# Patient Record
Sex: Male | Born: 1965 | Race: Black or African American | Hispanic: No | Marital: Single | Smoking: Never smoker
Health system: Southern US, Community
[De-identification: ages and names within clinical notes are randomized; demographics above are authoritative.]

## PROBLEM LIST (undated history)

## (undated) HISTORY — PX: HERNIA REPAIR: SHX51

---

## 2008-10-19 ENCOUNTER — Emergency Department: Payer: Self-pay | Admitting: Emergency Medicine

## 2010-06-28 ENCOUNTER — Emergency Department: Payer: Self-pay | Admitting: Emergency Medicine

## 2010-07-11 ENCOUNTER — Ambulatory Visit: Payer: Self-pay | Admitting: Surgery

## 2010-07-18 ENCOUNTER — Ambulatory Visit: Payer: Self-pay | Admitting: Surgery

## 2010-07-19 LAB — PATHOLOGY REPORT

## 2012-04-14 ENCOUNTER — Emergency Department: Payer: Self-pay | Admitting: Internal Medicine

## 2012-12-30 ENCOUNTER — Emergency Department: Payer: Self-pay | Admitting: Emergency Medicine

## 2013-06-01 ENCOUNTER — Emergency Department: Payer: Self-pay | Admitting: Emergency Medicine

## 2013-06-02 LAB — CBC
HCT: 42.2 % (ref 40.0–52.0)
HCT: 49.9 % (ref 40.0–52.0)
HGB: 14.1 g/dL (ref 13.0–18.0)
HGB: 16.9 g/dL (ref 13.0–18.0)
MCH: 28.2 pg (ref 26.0–34.0)
MCHC: 33.3 g/dL (ref 32.0–36.0)
MCV: 85 fL (ref 80–100)
Platelet: 314 10*3/uL (ref 150–440)
RBC: 5.94 10*6/uL — ABNORMAL HIGH (ref 4.40–5.90)
RDW: 14.8 % — ABNORMAL HIGH (ref 11.5–14.5)
WBC: 21.9 10*3/uL — ABNORMAL HIGH (ref 3.8–10.6)
WBC: 24.1 10*3/uL — ABNORMAL HIGH (ref 3.8–10.6)

## 2013-06-02 LAB — URINALYSIS, COMPLETE
Bilirubin,UR: NEGATIVE
Ketone: NEGATIVE
Nitrite: NEGATIVE
RBC,UR: 5 /HPF (ref 0–5)
WBC UR: 5 /HPF (ref 0–5)

## 2013-06-02 LAB — COMPREHENSIVE METABOLIC PANEL
Anion Gap: 9 (ref 7–16)
BUN: 11 mg/dL (ref 7–18)
Bilirubin,Total: 0.8 mg/dL (ref 0.2–1.0)
Co2: 22 mmol/L (ref 21–32)
Creatinine: 1.37 mg/dL — ABNORMAL HIGH (ref 0.60–1.30)
EGFR (African American): >60
Glucose: 110 mg/dL — ABNORMAL HIGH (ref 65–99)
SGPT (ALT): 16 U/L (ref 12–78)
Sodium: 130 mmol/L — ABNORMAL LOW (ref 136–145)
Total Protein: 9 g/dL — ABNORMAL HIGH (ref 6.4–8.2)

## 2013-06-02 LAB — LIPASE, BLOOD: Lipase: 95 U/L (ref 73–393)

## 2013-06-02 LAB — TROPONIN I: Troponin-I: <0.02 ng/mL

## 2013-06-03 LAB — BETA STREP CULTURE(ARMC)

## 2013-08-26 ENCOUNTER — Emergency Department: Payer: Self-pay | Admitting: Emergency Medicine

## 2014-03-01 ENCOUNTER — Emergency Department: Payer: Self-pay | Admitting: Emergency Medicine

## 2014-03-01 LAB — CBC
HCT: 48.9 % (ref 40.0–52.0)
HGB: 15.8 g/dL (ref 13.0–18.0)
MCH: 27.6 pg (ref 26.0–34.0)
MCHC: 32.3 g/dL (ref 32.0–36.0)
MCV: 86 fL (ref 80–100)
PLATELETS: 337 10*3/uL (ref 150–440)
RBC: 5.72 10*6/uL (ref 4.40–5.90)
RDW: 15 % — ABNORMAL HIGH (ref 11.5–14.5)
WBC: 25.2 10*3/uL — AB (ref 3.8–10.6)

## 2014-03-01 LAB — DIFFERENTIAL
BASOS ABS: 0.1 10*3/uL (ref 0.0–0.1)
Basophil %: 0.5 %
EOS ABS: 0 10*3/uL (ref 0.0–0.7)
Eosinophil %: 0.2 %
LYMPHS ABS: 3.1 10*3/uL (ref 1.0–3.6)
LYMPHS PCT: 12.5 %
MONO ABS: 1.9 x10 3/mm — AB (ref 0.2–1.0)
Monocyte %: 7.7 %
NEUTROS ABS: 19.9 10*3/uL — AB (ref 1.4–6.5)
Neutrophil %: 79.1 %

## 2014-03-01 LAB — BASIC METABOLIC PANEL
Anion Gap: 8 (ref 7–16)
BUN: 9 mg/dL (ref 7–18)
Calcium, Total: 9 mg/dL (ref 8.5–10.1)
Chloride: 100 mmol/L (ref 98–107)
Co2: 24 mmol/L (ref 21–32)
Creatinine: 1.34 mg/dL — ABNORMAL HIGH (ref 0.60–1.30)
Glucose: 141 mg/dL — ABNORMAL HIGH (ref 65–99)
Osmolality: 266 (ref 275–301)
POTASSIUM: 3.5 mmol/L (ref 3.5–5.1)
SODIUM: 132 mmol/L — AB (ref 136–145)

## 2014-03-04 LAB — BETA STREP CULTURE(ARMC)

## 2017-02-21 ENCOUNTER — Emergency Department: Payer: Self-pay

## 2017-02-21 ENCOUNTER — Emergency Department
Admission: EM | Admit: 2017-02-21 | Discharge: 2017-02-21 | Disposition: A | Discharge status: Home / Self Care | Payer: Self-pay | Attending: Emergency Medicine | Admitting: Emergency Medicine

## 2017-02-21 ENCOUNTER — Encounter: Payer: Self-pay | Admitting: Emergency Medicine

## 2017-02-21 DIAGNOSIS — J0100 Acute maxillary sinusitis, unspecified: Secondary | ICD-10-CM | POA: Insufficient documentation

## 2017-02-21 DIAGNOSIS — R51 Headache: Secondary | ICD-10-CM

## 2017-02-21 DIAGNOSIS — R519 Headache, unspecified: Secondary | ICD-10-CM

## 2017-02-21 LAB — COMPREHENSIVE METABOLIC PANEL
ALBUMIN: 3.7 g/dL (ref 3.5–5.0)
ALK PHOS: 63 U/L (ref 38–126)
ALT: 9 U/L — AB (ref 17–63)
ANION GAP: 7 (ref 5–15)
AST: 20 U/L (ref 15–41)
BILIRUBIN TOTAL: 0.9 mg/dL (ref 0.3–1.2)
BUN: 12 mg/dL (ref 6–20)
CALCIUM: 9.2 mg/dL (ref 8.9–10.3)
CO2: 25 mmol/L (ref 22–32)
CREATININE: 0.93 mg/dL (ref 0.61–1.24)
Chloride: 103 mmol/L (ref 101–111)
GFR calc Af Amer: >60 mL/min (ref >60)
GFR calc non Af Amer: >60 mL/min (ref >60)
GLUCOSE: 117 mg/dL — AB (ref 65–99)
Potassium: 4.4 mmol/L (ref 3.5–5.1)
Sodium: 135 mmol/L (ref 135–145)
Total Protein: 8.2 g/dL — ABNORMAL HIGH (ref 6.5–8.1)

## 2017-02-21 LAB — CBC WITH DIFFERENTIAL/PLATELET
BASOS PCT: 1 %
Basophils Absolute: 0.1 10*3/uL (ref 0–0.1)
Eosinophils Absolute: 0.1 10*3/uL (ref 0–0.7)
Eosinophils Relative: 0 %
HEMATOCRIT: 45.2 % (ref 40.0–52.0)
HEMOGLOBIN: 15.1 g/dL (ref 13.0–18.0)
LYMPHS ABS: 2.9 10*3/uL (ref 1.0–3.6)
Lymphocytes Relative: 19 %
MCH: 27.9 pg (ref 26.0–34.0)
MCHC: 33.5 g/dL (ref 32.0–36.0)
MCV: 83.3 fL (ref 80.0–100.0)
MONOS PCT: 10 %
Monocytes Absolute: 1.6 10*3/uL — ABNORMAL HIGH (ref 0.2–1.0)
NEUTROS ABS: 10.9 10*3/uL — AB (ref 1.4–6.5)
NEUTROS PCT: 70 %
Platelets: 383 10*3/uL (ref 150–440)
RBC: 5.42 MIL/uL (ref 4.40–5.90)
RDW: 15.5 % — ABNORMAL HIGH (ref 11.5–14.5)
WBC: 15.6 10*3/uL — ABNORMAL HIGH (ref 3.8–10.6)

## 2017-02-21 LAB — SEDIMENTATION RATE: Sed Rate: 9 mm/hr (ref 0–15)

## 2017-02-21 MED ORDER — DEXAMETHASONE SODIUM PHOSPHATE 10 MG/ML IJ SOLN
10.0000 mg | Freq: Once | INTRAMUSCULAR | Status: AC
  Administered 2017-02-21: 10 mg via INTRAVENOUS
  Filled 2017-02-21: qty 1

## 2017-02-21 MED ORDER — PREDNISONE 10 MG (21) PO TBPK: ORAL_TABLET | ORAL | 0 refills | Status: DC

## 2017-02-21 MED ORDER — DIPHENHYDRAMINE HCL 50 MG/ML IJ SOLN
25.0000 mg | Freq: Once | INTRAMUSCULAR | Status: AC
  Administered 2017-02-21: 25 mg via INTRAVENOUS
  Filled 2017-02-21: qty 1

## 2017-02-21 MED ORDER — SODIUM CHLORIDE 0.9 % IV SOLN
1000.0000 mL | Freq: Once | INTRAVENOUS | Status: AC
  Administered 2017-02-21: 1000 mL via INTRAVENOUS

## 2017-02-21 MED ORDER — MORPHINE SULFATE (PF) 4 MG/ML IV SOLN
4.0000 mg | Freq: Once | INTRAVENOUS | Status: AC
Start: 2017-02-21 — End: 2017-02-21
  Administered 2017-02-21: 4 mg via INTRAVENOUS
  Filled 2017-02-21: qty 1

## 2017-02-21 MED ORDER — CEFTRIAXONE SODIUM IN DEXTROSE 20 MG/ML IV SOLN
1.0000 g | Freq: Once | INTRAVENOUS | Status: AC
  Administered 2017-02-21: 1 g via INTRAVENOUS
  Filled 2017-02-21: qty 50

## 2017-02-21 MED ORDER — AMOXICILLIN-POT CLAVULANATE 875-125 MG PO TABS: 1.0000 | ORAL_TABLET | Freq: Two times a day (BID) | ORAL | 0 refills | Status: AC

## 2017-02-21 MED ORDER — METOCLOPRAMIDE HCL 5 MG/ML IJ SOLN
10.0000 mg | Freq: Once | INTRAMUSCULAR | Status: AC
  Administered 2017-02-21: 10 mg via INTRAVENOUS
  Filled 2017-02-21: qty 2

## 2017-02-21 MED ORDER — KETOROLAC TROMETHAMINE 30 MG/ML IJ SOLN
30.0000 mg | Freq: Once | INTRAMUSCULAR | Status: AC
  Administered 2017-02-21: 30 mg via INTRAVENOUS
  Filled 2017-02-21: qty 1

## 2017-02-21 MED ORDER — OXYMETAZOLINE HCL 0.05 % NA SOLN: 2.0000 | Freq: Two times a day (BID) | NASAL | 2 refills | Status: AC

## 2017-02-21 NOTE — ED Triage Notes (Signed)
Pt to triage via w/c, appears uncomfortable; st since Sunday has had left sided HA with no accomp symptoms; denies hx of same

## 2017-02-21 NOTE — ED Provider Notes (Signed)
[facility] Emergency Department Provider Note       Time seen: ----------------------------------------- 7:09 AM on 02/21/2017 -----------------------------------------     I have reviewed the triage vital signs and the nursing notes.   HISTORY   Chief Complaint Headache    HPI Nicholas Thornton is a 51 y.o. male who presents to the ED for severe left-sided headache since Sunday. Patient describes pain as 9 out of 10 and a throbbing type pain. Patient states he's had fever and chills, left-sided nasal congestion and some nausea. Patient does not describe light or sound is affecting his symptoms.   History reviewed. No pertinent past medical history.  There are no active problems to display for this patient.   Past Surgical History:  Procedure Laterality Date  . HERNIA REPAIR      Allergies Patient has no known allergies.  Social History Social History  Substance Use Topics  . Smoking status: Never Smoker  . Smokeless tobacco: Never Used  . Alcohol use Not on file    Review of Systems Constitutional: Negative for fever. Eyes: Negative for vision changes ENT:  Positive for left-sided congestion, facial pain, sore throat Cardiovascular: Negative for chest pain. Respiratory: Negative for shortness of breath. Gastrointestinal: Negative for abdominal pain, vomiting and diarrhea. Genitourinary: Negative for dysuria. Musculoskeletal: Negative for back pain. Skin: Negative for rash. Neurological: Positive for headache  All systems negative/normal/unremarkable except as stated in the HPI  ____________________________________________   PHYSICAL EXAM:  VITAL SIGNS: ED Triage Vitals  Enc Vitals Group     BP 02/21/17 0254 (!) 137/93     Pulse Rate 02/21/17 0254 (!) 106     Resp 02/21/17 0254 18     Temp 02/21/17 0254 99.2 F (37.3 C)     Temp Source 02/21/17 0254 Oral     SpO2 02/21/17 0254 95 %     Weight 02/21/17 0255 270 lb (122.5  kg)     Height 02/21/17 0255 6\' 2"  (1.88 m)     Head Circumference --      Peak Flow --      Pain Score 02/21/17 0254 10     Pain Loc --      Pain Edu? --      Excl. in GC? --     Constitutional: Alert and oriented. Mild distress Eyes: Conjunctivae are normal. PERRL. Normal extraocular movements. ENT   Head: Normocephalic and atraumatic.   Nose: No congestion/rhinnorhea.   Mouth/Throat: Mucous membranes are moist.   Neck: No stridor. Cardiovascular: Normal rate, regular rhythm. No murmurs, rubs, or gallops. Respiratory: Normal respiratory effort without tachypnea nor retractions. Breath sounds are clear and equal bilaterally. No wheezes/rales/rhonchi. Gastrointestinal: Soft and nontender. Normal bowel sounds Musculoskeletal: Nontender with normal range of motion in extremities. No lower extremity tenderness nor edema. Neurologic:  Normal speech and language. No gross focal neurologic deficits are appreciated.  Skin:  Skin is warm, dry and intact. No rash noted. Psychiatric: Mood and affect are normal. Speech and behavior are normal.  ____________________________________________  EKG: Interpreted by me. Sinus tachycardia with rate 100 bpm, normal PR interval, normal QRS, normal QT.  ____________________________________________  ED COURSE:  Pertinent labs & imaging results that were available during my care of the patient were reviewed by me and considered in my medical decision making (see chart for details). Patient presents for severe left-sided headache, we will assess with labs and imaging as indicated.   Procedures ____________________________________________   LABS (pertinent positives/negatives)  Labs Reviewed  CBC WITH DIFFERENTIAL/PLATELET - Abnormal; Notable for the following:       Result Value   WBC 15.6 (*)    RDW 15.5 (*)    Neutro Abs 10.9 (*)    Monocytes Absolute 1.6 (*)    All other components within normal limits  COMPREHENSIVE METABOLIC  PANEL - Abnormal; Notable for the following:    Glucose, Bld 117 (*)    Total Protein 8.2 (*)    ALT 9 (*)    All other components within normal limits  SEDIMENTATION RATE  CBG MONITORING, ED    RADIOLOGY Images were viewed by me  CT head IMPRESSION: No acute intracranial abnormality noted.  Opacification of the left maxillary antrum of uncertain chronicity.  ____________________________________________  FINAL ASSESSMENT AND PLAN  Headache, Maxillary sinusitis  Plan: Patient's labs and imaging were dictated above. Patient had presented for left-sided facial pain with tenderness over his left maxillary sinus. CT scan confirmed complete opacification of the left maxillary sinus. He was given steroids as well as antibiotics and pain medication. He'll be referred to ENT with similar treatment and outpatient follow-up.   Emily Filbert, MD   Note: This note was generated in part or whole with voice recognition software. Voice recognition is usually quite accurate but there are transcription errors that can and very often do occur. I apologize for any typographical errors that were not detected and corrected.     Emily Filbert, MD 02/21/17 [phone]

## 2017-10-13 IMAGING — CT CT HEAD W/O CM
3 series · 15 of 47 positions shown, 18 images · non-contrast
Comparison: None.

CLINICAL DATA: Headaches

EXAM:
CT HEAD WITHOUT CONTRAST
TECHNIQUE: Contiguous axial images were obtained from the base of the skull
through the vertex without intravenous contrast.

[Series 2: head wo · axial · 0.44mm/px · z∈[-70,+55]mm · 9 of 31 slices shown, 12 images]
[im 3/31  brain]
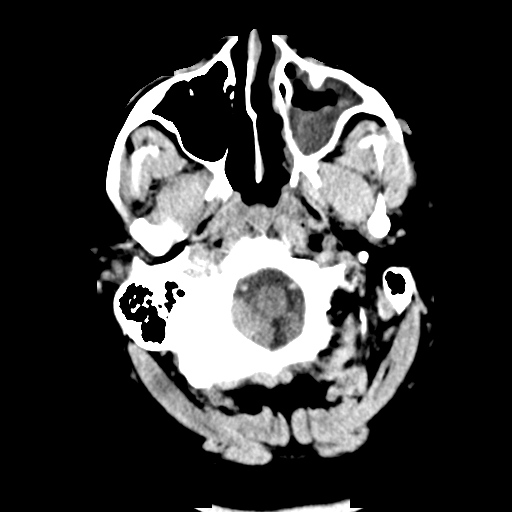
[im 3/31  bone]
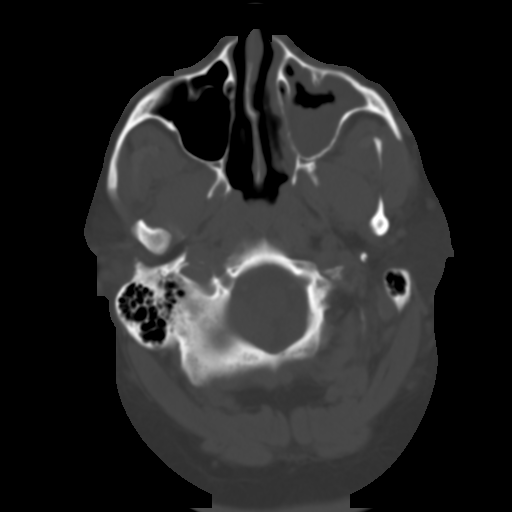
[im 6/31  brain]
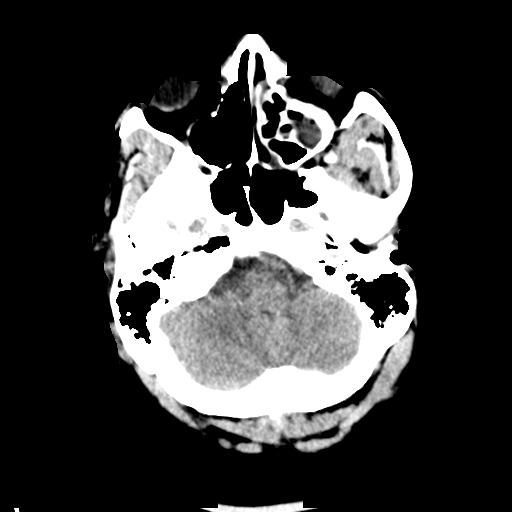
[im 9/31  brain]
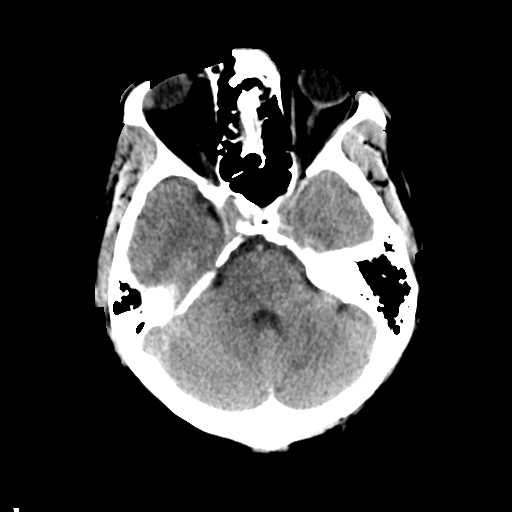
[im 12/31  brain]
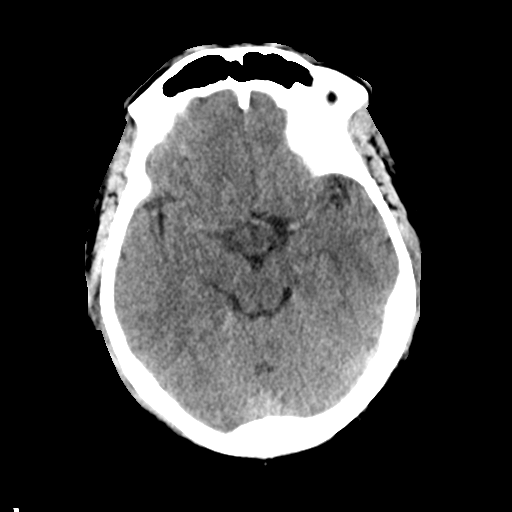
[im 16/31  brain]
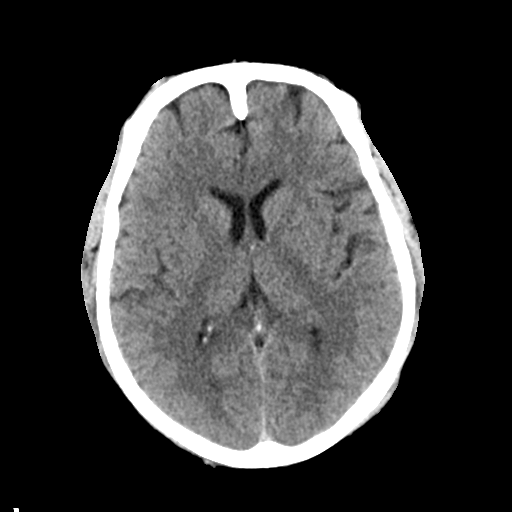
[im 16/31  bone]
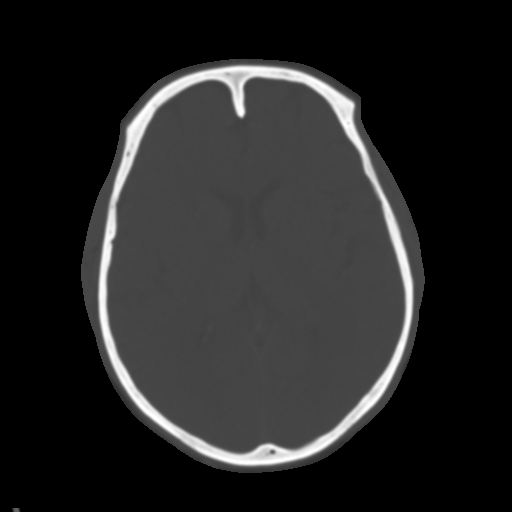
[im 19/31  brain]
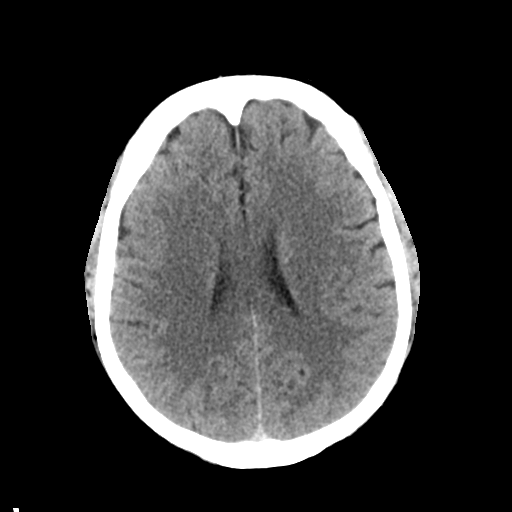
[im 22/31  brain]
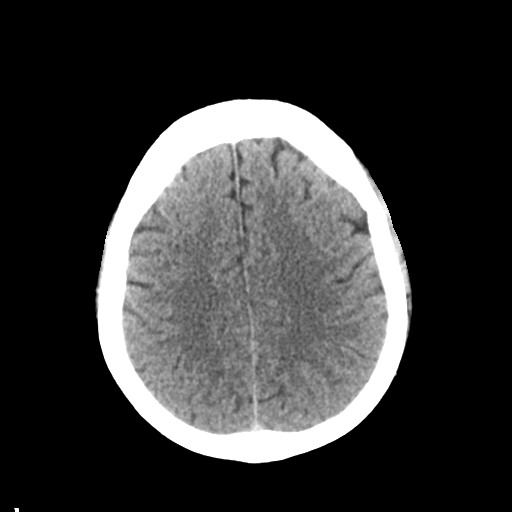
[im 25/31  brain]
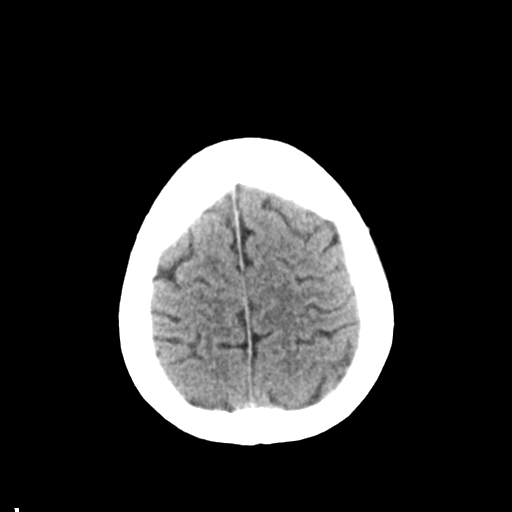
[im 28/31  brain]
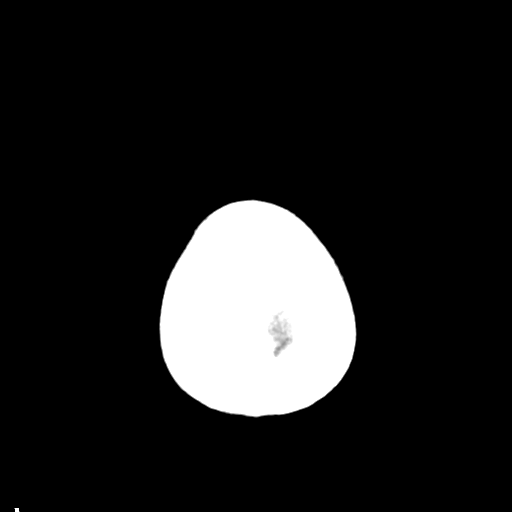
[im 28/31  bone]
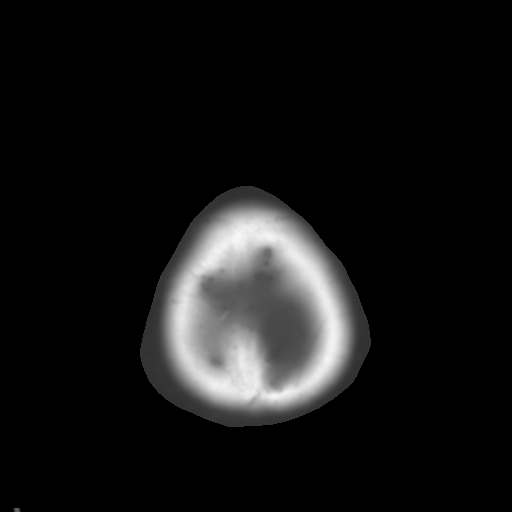

[Series 4: coronal soft tissue · coronal · 0.32mm/px · 3 of 66 slices shown]
[im 22/66  brain]
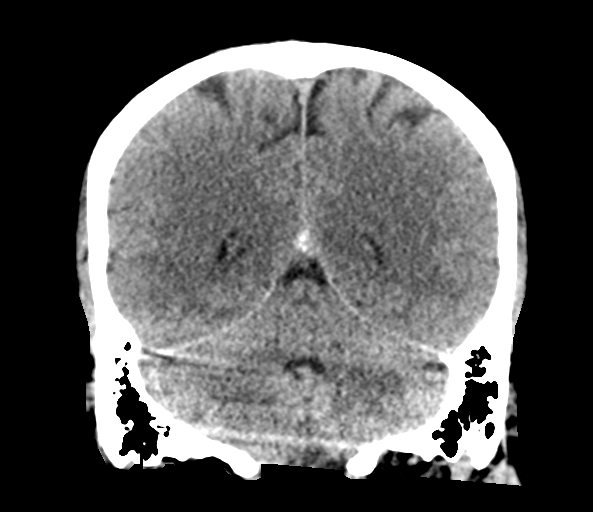
[im 29/66  brain]
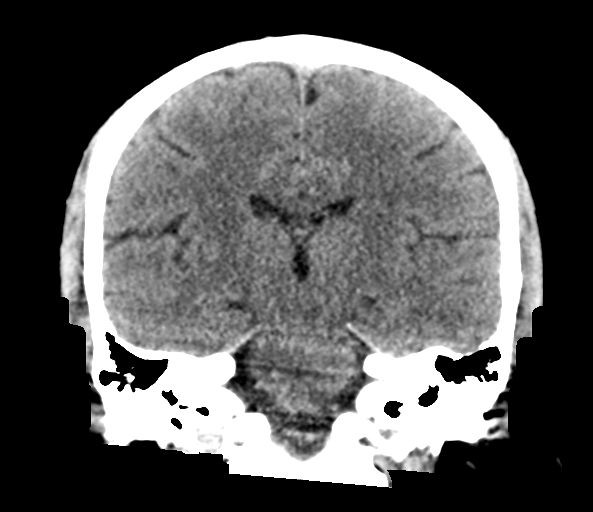
[im 37/66  brain]
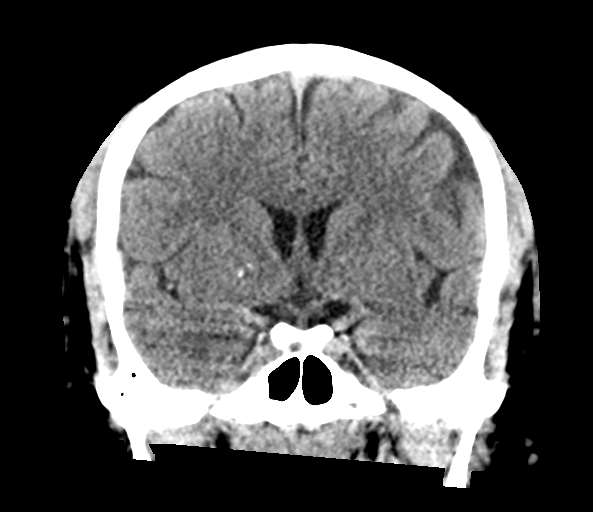

[Series 5: sagittal soft tissue · sagittal · 0.33mm/px · 3 of 56 slices shown]
[im 19/56  brain]
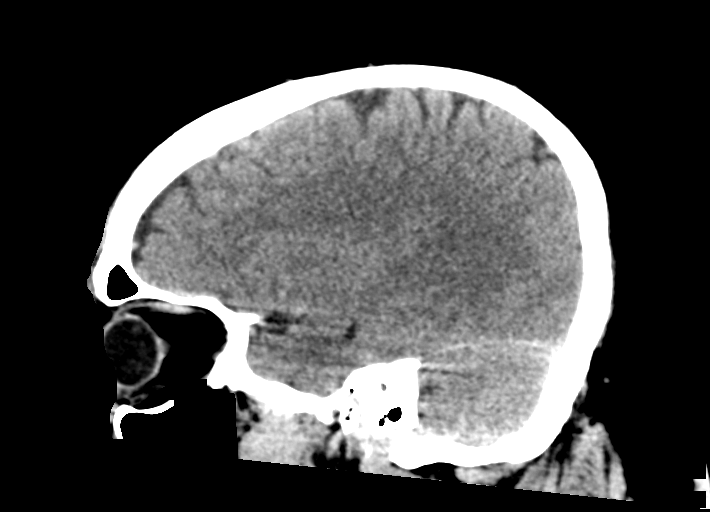
[im 28/56  brain]
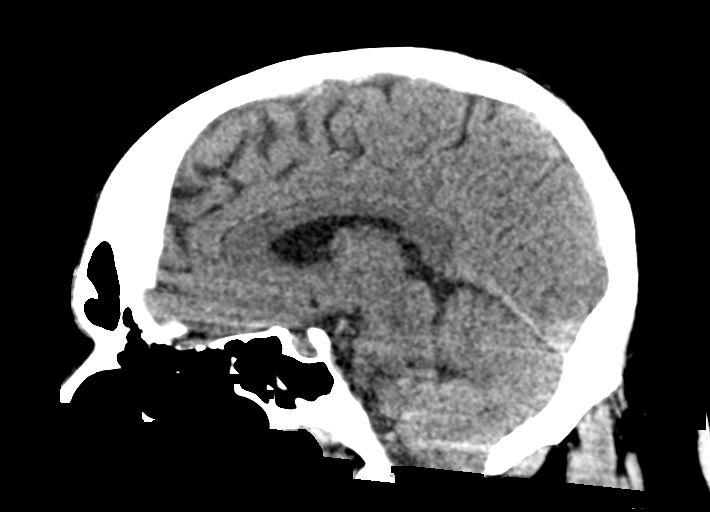
[im 37/56  brain]
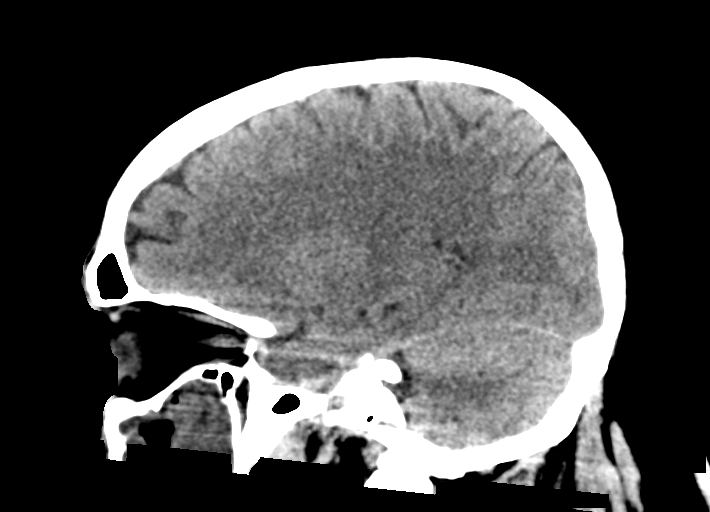

[15 of 47 positions shown; findings below may reference images not displayed]

FINDINGS: Brain: No evidence of acute infarction, hemorrhage, hydrocephalus,
extra-axial collection or mass lesion/mass effect.

Vascular: No hyperdense vessel or unexpected calcification.

Skull: Normal. Negative for fracture or focal lesion.

Sinuses/Orbits: Opacification of the left maxillary antrum is noted.
This is of uncertain chronicity.

Other: None
IMPRESSION: No acute intracranial abnormality noted.

Opacification of the left maxillary antrum of uncertain chronicity.

## 2018-12-05 ENCOUNTER — Encounter: Payer: Self-pay | Admitting: Emergency Medicine

## 2018-12-05 ENCOUNTER — Emergency Department
Admission: EM | Admit: 2018-12-05 | Discharge: 2018-12-05 | Disposition: A | Discharge status: Home / Self Care | Payer: Self-pay | Attending: Emergency Medicine | Admitting: Emergency Medicine

## 2018-12-05 DIAGNOSIS — J0100 Acute maxillary sinusitis, unspecified: Secondary | ICD-10-CM | POA: Insufficient documentation

## 2018-12-05 DIAGNOSIS — J3489 Other specified disorders of nose and nasal sinuses: Secondary | ICD-10-CM | POA: Insufficient documentation

## 2018-12-05 MED ORDER — AMOXICILLIN-POT CLAVULANATE 875-125 MG PO TABS: 1.0000 | ORAL_TABLET | Freq: Two times a day (BID) | ORAL | 0 refills | Status: AC

## 2018-12-05 MED ORDER — KETOROLAC TROMETHAMINE 30 MG/ML IJ SOLN
30.0000 mg | Freq: Once | INTRAMUSCULAR | Status: AC
  Administered 2018-12-05: 30 mg via INTRAMUSCULAR
  Filled 2018-12-05: qty 1

## 2018-12-05 MED ORDER — NAPROXEN 500 MG PO TABS: 500.0000 mg | ORAL_TABLET | Freq: Two times a day (BID) | ORAL | 0 refills | Status: DC

## 2018-12-05 MED ORDER — AMOXICILLIN-POT CLAVULANATE 875-125 MG PO TABS
1.0000 | ORAL_TABLET | Freq: Once | ORAL | Status: AC
  Administered 2018-12-05: 1 via ORAL
  Filled 2018-12-05: qty 1

## 2018-12-05 NOTE — ED Triage Notes (Signed)
Facial pain, sinus pressure x 2 days.  Has had similar symptoms in th past, which was diagnosed as a sinus infection.

## 2018-12-05 NOTE — ED Notes (Signed)
AAOx3.  Skin warm and dry. NAD.  Sinus congestion noted.

## 2018-12-05 NOTE — ED Provider Notes (Signed)
[facility] Emergency Department Provider Note  ____________________________________________  Time seen: Approximately 10:45 AM  I have reviewed the triage vital signs and the nursing notes.   HISTORY  Chief Complaint Facial Pain    HPI Nicholas Thornton is a 53 y.o. male that presents to the emergency department for evaluation of facial pain and sinus pressure for 2 days. He has been blowing out green sputum. He has had something similar in the past which cleared with antibiotics. No cough, CP, SOB.    History reviewed. No pertinent past medical history.  There are no active problems to display for this patient.   Past Surgical History:  Procedure Laterality Date  . HERNIA REPAIR      Prior to Admission medications   Medication Sig Start Date End Date Taking? Authorizing Provider  amoxicillin-clavulanate (AUGMENTIN) 875-125 MG tablet Take 1 tablet by mouth 2 (two) times daily for 10 days. 12/05/18 12/15/18  Enid Derry, PA-C  naproxen (NAPROSYN) 500 MG tablet Take 1 tablet (500 mg total) by mouth 2 (two) times daily with a meal. 12/05/18 12/05/19  Enid Derry, PA-C  predniSONE (STERAPRED UNI-PAK 21 TAB) 10 MG (21) TBPK tablet Dispense steroid taper pack as directed 02/21/17   Emily Filbert, MD    Allergies Patient has no known allergies.  No family history on file.  Social History Social History   Tobacco Use  . Smoking status: Never Smoker  . Smokeless tobacco: Never Used  Substance Use Topics  . Alcohol use: Not on file  . Drug use: Not on file     Review of Systems  Constitutional: No fever/chills Eyes: No visual changes. No discharge. ENT: Positive for congestion and rhinorrhea. Cardiovascular: No chest pain. Respiratory: Negative for cough. No SOB. Gastrointestinal: No abdominal pain.  No nausea, no vomiting.   Musculoskeletal: Negative for musculoskeletal pain. Skin: Negative for rash, abrasions, lacerations,  ecchymosis. Neurological: Positive for headaches.   ____________________________________________   PHYSICAL EXAM:  VITAL SIGNS: ED Triage Vitals [12/05/18 1010]  Enc Vitals Group     BP      Pulse      Resp      Temp      Temp src      SpO2      Weight 270 lb (122.5 kg)     Height 6\' 2"  (1.88 m)     Head Circumference      Peak Flow      Pain Score 10     Pain Loc      Pain Edu?      Excl. in GC?      Constitutional: Alert and oriented. Well appearing and in no acute distress. Eyes: Conjunctivae are normal. PERRL. EOMI. No discharge. Head: Atraumatic. ENT: Maxillary sinus tenderness       Ears: Tympanic membranes pearly gray with good landmarks. No discharge.      Nose: Mild congestion/rhinnorhea.      Mouth/Throat: Mucous membranes are moist. Oropharynx non-erythematous. Tonsils not enlarged. No exudates. Uvula midline. Neck: No stridor.   Hematological/Lymphatic/Immunilogical: No cervical lymphadenopathy. Cardiovascular: Normal rate, regular rhythm.  Good peripheral circulation. Respiratory: Normal respiratory effort without tachypnea or retractions. Lungs CTAB. Good air entry to the bases with no decreased or absent breath sounds. Gastrointestinal: Bowel sounds 4 quadrants. Soft and nontender to palpation. No guarding or rigidity. No palpable masses. No distention. Musculoskeletal: Full range of motion to all extremities. No gross deformities appreciated. Neurologic:  Normal speech and language. No gross  focal neurologic deficits are appreciated.  Skin:  Skin is warm, dry and intact. No rash noted. Psychiatric: Mood and affect are normal. Speech and behavior are normal. Patient exhibits appropriate insight and judgement.   ____________________________________________   LABS (all labs ordered are listed, but only abnormal results are displayed)  Labs Reviewed - No data to  display ____________________________________________  EKG   ____________________________________________  RADIOLOGY   No results found.  ____________________________________________    PROCEDURES  Procedure(s) performed:    Procedures    Medications  ketorolac (TORADOL) 30 MG/ML injection 30 mg (30 mg Intramuscular Given 12/05/18 1106)  amoxicillin-clavulanate (AUGMENTIN) 875-125 MG per tablet 1 tablet (1 tablet Oral Given 12/05/18 1106)     ____________________________________________   INITIAL IMPRESSION / ASSESSMENT AND PLAN / ED COURSE  Pertinent labs & imaging results that were available during my care of the patient were reviewed by me and considered in my medical decision making (see chart for details).  Review of the [place] CSRS was performed in accordance of the [facility] prior to dispensing any controlled drugs.   Patient's diagnosis is consistent with sinusitis. Vital signs and exam are reassuring. Patient appears well and is staying well hydrated. Patient should alternate tylenol and ibuprofen for fever. Patient feels comfortable going home. Patient will be discharged home with prescriptions for Augmentin and naprosyn. Patient is to follow up with primary care as needed or otherwise directed. Patient is given ED precautions to return to the ED for any worsening or new symptoms.     ____________________________________________  FINAL CLINICAL IMPRESSION(S) / ED DIAGNOSES  Final diagnoses:  Acute non-recurrent maxillary sinusitis      NEW MEDICATIONS STARTED DURING THIS VISIT:  ED Discharge Orders         Ordered    amoxicillin-clavulanate (AUGMENTIN) 875-125 MG tablet  2 times daily     12/05/18 1059    naproxen (NAPROSYN) 500 MG tablet  2 times daily with meals     12/05/18 1059              This chart was dictated using voice recognition software/Dragon. Despite best efforts to proofread, errors can occur which can change the meaning. Any  change was purely unintentional.    Enid Derry, PA-C 12/05/18 1534    Jene Every, MD 12/05/18 1539

## 2018-12-23 ENCOUNTER — Encounter: Payer: Self-pay | Admitting: Emergency Medicine

## 2018-12-23 ENCOUNTER — Emergency Department
Admission: EM | Admit: 2018-12-23 | Discharge: 2018-12-23 | Disposition: A | Discharge status: Home / Self Care | Payer: Self-pay | Attending: Emergency Medicine | Admitting: Emergency Medicine

## 2018-12-23 ENCOUNTER — Other Ambulatory Visit: Payer: Self-pay

## 2018-12-23 DIAGNOSIS — Z79899 Other long term (current) drug therapy: Secondary | ICD-10-CM | POA: Insufficient documentation

## 2018-12-23 DIAGNOSIS — K529 Noninfective gastroenteritis and colitis, unspecified: Secondary | ICD-10-CM | POA: Insufficient documentation

## 2018-12-23 LAB — CBC WITH DIFFERENTIAL/PLATELET
ABS IMMATURE GRANULOCYTES: 0.05 10*3/uL (ref 0.00–0.07)
Basophils Absolute: 0 10*3/uL (ref 0.0–0.1)
Basophils Relative: 0 %
EOS PCT: 0 %
Eosinophils Absolute: 0 10*3/uL (ref 0.0–0.5)
HCT: 48.9 % (ref 39.0–52.0)
HEMOGLOBIN: 16.2 g/dL (ref 13.0–17.0)
Immature Granulocytes: 1 %
Lymphocytes Relative: 15 %
Lymphs Abs: 1.3 10*3/uL (ref 0.7–4.0)
MCH: 27.9 pg (ref 26.0–34.0)
MCHC: 33.1 g/dL (ref 30.0–36.0)
MCV: 84.3 fL (ref 80.0–100.0)
MONO ABS: 0.8 10*3/uL (ref 0.1–1.0)
MONOS PCT: 9 %
NEUTROS ABS: 6.6 10*3/uL (ref 1.7–7.7)
Neutrophils Relative %: 75 %
PLATELETS: 343 10*3/uL (ref 150–400)
RBC: 5.8 MIL/uL (ref 4.22–5.81)
RDW: 14.1 % (ref 11.5–15.5)
WBC: 8.7 10*3/uL (ref 4.0–10.5)
nRBC: 0 % (ref 0.0–0.2)

## 2018-12-23 LAB — URINALYSIS, COMPLETE (UACMP) WITH MICROSCOPIC
Bacteria, UA: NONE SEEN
Bilirubin Urine: NEGATIVE
GLUCOSE, UA: NEGATIVE mg/dL
KETONES UR: NEGATIVE mg/dL
Leukocytes,Ua: NEGATIVE
Nitrite: NEGATIVE
PROTEIN: NEGATIVE mg/dL
Specific Gravity, Urine: 1.024 (ref 1.005–1.030)
pH: 7 (ref 5.0–8.0)

## 2018-12-23 LAB — COMPREHENSIVE METABOLIC PANEL
ALBUMIN: 4 g/dL (ref 3.5–5.0)
ALT: 16 U/L (ref 0–44)
AST: 37 U/L (ref 15–41)
Alkaline Phosphatase: 61 U/L (ref 38–126)
Anion gap: 8 (ref 5–15)
BUN: 14 mg/dL (ref 6–20)
CHLORIDE: 103 mmol/L (ref 98–111)
CO2: 23 mmol/L (ref 22–32)
CREATININE: 0.82 mg/dL (ref 0.61–1.24)
Calcium: 8.5 mg/dL — ABNORMAL LOW (ref 8.9–10.3)
GFR calc Af Amer: >60 mL/min (ref >60)
GFR calc non Af Amer: >60 mL/min (ref >60)
GLUCOSE: 122 mg/dL — AB (ref 70–99)
Potassium: 3.8 mmol/L (ref 3.5–5.1)
SODIUM: 134 mmol/L — AB (ref 135–145)
Total Bilirubin: 1.2 mg/dL (ref 0.3–1.2)
Total Protein: 7.8 g/dL (ref 6.5–8.1)

## 2018-12-23 LAB — LIPASE, BLOOD: Lipase: 30 U/L (ref 11–51)

## 2018-12-23 MED ORDER — SODIUM CHLORIDE 0.9 % IV SOLN
1000.0000 mL | Freq: Once | INTRAVENOUS | Status: AC
  Administered 2018-12-23: 1000 mL via INTRAVENOUS

## 2018-12-23 MED ORDER — KETOROLAC TROMETHAMINE 30 MG/ML IJ SOLN
30.0000 mg | Freq: Once | INTRAMUSCULAR | Status: AC
  Administered 2018-12-23: 30 mg via INTRAVENOUS
  Filled 2018-12-23: qty 1

## 2018-12-23 MED ORDER — ONDANSETRON 4 MG PO TBDP: 4.0000 mg | ORAL_TABLET | Freq: Three times a day (TID) | ORAL | 0 refills | Status: DC | PRN

## 2018-12-23 MED ORDER — ONDANSETRON HCL 4 MG/2ML IJ SOLN
4.0000 mg | Freq: Once | INTRAMUSCULAR | Status: AC
  Administered 2018-12-23: 4 mg via INTRAVENOUS
  Filled 2018-12-23: qty 2

## 2018-12-23 NOTE — ED Triage Notes (Signed)
Patient ambulatory to triage with steady gait, without difficulty or distress noted; pt reports body aches since last night accomp by N/V/D

## 2018-12-23 NOTE — ED Provider Notes (Signed)
[facility] Emergency Department Provider Note   ____________________________________________    I have reviewed the triage vital signs and the nursing notes.   HISTORY  Chief Complaint Emesis     HPI Nicholas Thornton is a 53 y.o. male who presents with complaints of nausea vomiting and diarrhea.  Also reports feeling achy with intermittent chills.  Symptoms started last night.  Has not take anything for this.  No sick contacts reported.  No recent travel.  Occasional abdominal cramping.  No new medications.  History reviewed. No pertinent past medical history.  There are no active problems to display for this patient.   Past Surgical History:  Procedure Laterality Date  . HERNIA REPAIR      Prior to Admission medications   Medication Sig Start Date End Date Taking? Authorizing Provider  naproxen (NAPROSYN) 500 MG tablet Take 1 tablet (500 mg total) by mouth 2 (two) times daily with a meal. 12/05/18 12/05/19  Enid Derry, PA-C  ondansetron (ZOFRAN ODT) 4 MG disintegrating tablet Take 1 tablet (4 mg total) by mouth every 8 (eight) hours as needed. 12/23/18   Jene Every, MD  predniSONE (STERAPRED UNI-PAK 21 TAB) 10 MG (21) TBPK tablet Dispense steroid taper pack as directed 02/21/17   Emily Filbert, MD     Allergies Patient has no known allergies.  No family history on file.  Social History Social History   Tobacco Use  . Smoking status: Never Smoker  . Smokeless tobacco: Never Used  Substance Use Topics  . Alcohol use: Not on file  . Drug use: Not on file    Review of Systems  Constitutional: Chills Eyes: No visual changes.  ENT: No sore throat. Cardiovascular: Denies chest pain. Respiratory: Denies shortness of breath. Gastrointestinal: As above Genitourinary: Negative for dysuria. Musculoskeletal: Myalgias Skin: Negative for rash. Neurological: Negative for headaches     ____________________________________________   PHYSICAL EXAM:  VITAL SIGNS: ED Triage Vitals  Enc Vitals Group     BP 12/23/18 1918 121/80     Pulse Rate 12/23/18 1918 (!) 120     Resp 12/23/18 1918 20     Temp 12/23/18 1918 98.6 F (37 C)     Temp Source 12/23/18 1918 Oral     SpO2 12/23/18 1918 97 %     Weight 12/23/18 1917 122.5 kg (270 lb)     Height 12/23/18 1917 1.88 m (6\' 2" )     Head Circumference --      Peak Flow --      Pain Score 12/23/18 1917 7     Pain Loc --      Pain Edu? --      Excl. in GC? --     Constitutional: Alert and oriented.   Nose: No congestion/rhinnorhea. Mouth/Throat: Mucous membranes are moist.    Cardiovascular: Tachycardia, regular rhythm. Grossly normal heart sounds.  Good peripheral circulation. Respiratory: Normal respiratory effort.  No retractions. Lungs CTAB. Gastrointestinal: Soft and nontender. No distention.  No CVA tenderness.  Musculoskeletal: No lower extremity tenderness nor edema.  Warm and well perfused Neurologic:  Normal speech and language. No gross focal neurologic deficits are appreciated.  Skin:  Skin is warm, dry and intact. No rash noted. Psychiatric: Mood and affect are normal. Speech and behavior are normal.  ____________________________________________   LABS (all labs ordered are listed, but only abnormal results are displayed)  Labs Reviewed  COMPREHENSIVE METABOLIC PANEL - Abnormal; Notable for the following components:  Result Value   Sodium 134 (*)    Glucose, Bld 122 (*)    Calcium 8.5 (*)    All other components within normal limits  URINALYSIS, COMPLETE (UACMP) WITH MICROSCOPIC - Abnormal; Notable for the following components:   Color, Urine YELLOW (*)    APPearance CLEAR (*)    Hgb urine dipstick SMALL (*)    All other components within normal limits  CBC WITH DIFFERENTIAL/PLATELET  LIPASE, BLOOD    ____________________________________________  EKG  None ____________________________________________  RADIOLOGY  None ____________________________________________   PROCEDURES  Procedure(s) performed: No  Procedures   Critical Care performed: No ____________________________________________   INITIAL IMPRESSION / ASSESSMENT AND PLAN / ED COURSE  Pertinent labs & imaging results that were available during my care of the patient were reviewed by me and considered in my medical decision making (see chart for details).  Patient presents with nausea vomiting and diarrhea, suspicious for viral gastroenteritis given myalgias and chills.  We will treat with IV fluids, IV Toradol, IV Zofran and reevaluate  Lab work is overall reassuring.  No abdominal tenderness to palpation  ----------------------------------------- 9:37 PM on 12/23/2018 -----------------------------------------  Patient reports feeling significantly better after treatment, he is appropriate for discharge at this time, return precautions discussed   ____________________________________________   FINAL CLINICAL IMPRESSION(S) / ED DIAGNOSES  Final diagnoses:  Gastroenteritis        Note:  This document was prepared using Dragon voice recognition software and may include unintentional dictation errors.   Jene Every, MD 12/23/18 2138

## 2018-12-23 NOTE — ED Notes (Signed)
Pt states abd pain, N/V/D/HA all started last night.

## 2019-01-10 ENCOUNTER — Emergency Department
Admission: EM | Admit: 2019-01-10 | Discharge: 2019-01-10 | Disposition: A | Discharge status: Home / Self Care | Payer: Self-pay | Attending: Emergency Medicine | Admitting: Emergency Medicine

## 2019-01-10 ENCOUNTER — Encounter: Payer: Self-pay | Admitting: Intensive Care

## 2019-01-10 ENCOUNTER — Other Ambulatory Visit: Payer: Self-pay

## 2019-01-10 DIAGNOSIS — K047 Periapical abscess without sinus: Secondary | ICD-10-CM | POA: Insufficient documentation

## 2019-01-10 MED ORDER — KETOROLAC TROMETHAMINE 30 MG/ML IJ SOLN
30.0000 mg | Freq: Once | INTRAMUSCULAR | Status: AC
  Administered 2019-01-10: 30 mg via INTRAMUSCULAR
  Filled 2019-01-10: qty 1

## 2019-01-10 MED ORDER — AMOXICILLIN 500 MG PO CAPS
500.0000 mg | ORAL_CAPSULE | Freq: Once | ORAL | Status: AC
  Administered 2019-01-10: 500 mg via ORAL
  Filled 2019-01-10: qty 1

## 2019-01-10 MED ORDER — AMOXICILLIN 500 MG PO CAPS: 500.0000 mg | ORAL_CAPSULE | Freq: Three times a day (TID) | ORAL | 0 refills | Status: DC

## 2019-01-10 MED ORDER — TRAMADOL HCL 50 MG PO TABS: 50.0000 mg | ORAL_TABLET | Freq: Four times a day (QID) | ORAL | 0 refills | Status: DC | PRN

## 2019-01-10 NOTE — ED Notes (Signed)
See triage note  Presents with possible dental abscess   Pain is to right side and started about 3 days ago

## 2019-01-10 NOTE — ED Triage Notes (Signed)
Patient c/o dental abscess on right side X3 days ago.

## 2019-01-10 NOTE — Discharge Instructions (Addendum)
Follow-up with your regular doctor if not better in 3 days.  Return emergency department worsening.  Follow-up with 1 of the following dental clinics if you are unable to use your own dentist. OPTIONS FOR DENTAL FOLLOW UP CARE  [place] [facility] - Local Safety Net Dental Clinics [email or web address]   [facility] [phone])  Sharl Ma [phone])  [facility] [phone] ext [phone])  [facility] [phone])  [facility] [phone]) This clinic caters to the indigent population and is on a lottery system. Location: [employer] [facility], [employer], [address] [place] Clinic Hours: Wednesdays from 6pm - 9pm, patients seen by a lottery system. For dates, call or go to [email or web address] Services: Cleanings, fillings and simple extractions. Payment Options: DENTAL WORK IS FREE OF CHARGE. Bring proof of income or support. Best way to get seen: Arrive at 5:15 pm - this is a lottery, NOT first come/first serve, so arriving earlier will not increase your chances of being seen.     [facility] [phone] Select option 1 for emergencies   Location: [facility], [place], [address] [place] Clinic Hours: No walk-ins accepted - call the day before to schedule an appointment. Check in times are 9:30 am and 1:30 pm. Services: Simple extractions, temporary fillings, pulpectomy/pulp debridement, uncomplicated abscess drainage. Payment Options: PAYMENT IS DUE AT THE TIME OF SERVICE.  Fee is usually $100-200, additional surgical procedures (e.g. abscess drainage) may be extra. Cash, checks, Visa/MasterCard accepted.  Can file Medicaid if patient is covered for dental - patient should call case worker to check. No discount for [facility]  patients. Best way to get seen: MUST call the day before and get onto the schedule. Can usually be seen the next 1-2 days. No walk-ins accepted.     [facility] [phone]   Location: [facility], [address] [place] Clinic Hours: M, W, Th, F 8am or 1:30pm, Tues 9a or 1:30 - first come/first served. Services: Simple extractions, temporary fillings, uncomplicated abscess drainage.  You do not need to be an [facility] resident. Payment Options: PAYMENT IS DUE AT THE TIME OF SERVICE. Dental insurance, otherwise sliding scale - bring proof of income or support. Depending on income and treatment needed, cost is usually $50-200. Best way to get seen: Arrive early as it is first come/first served.     [facility] [phone]   Location: [address] Clinic Hours: Mon-Thu 8a-5p Services: Most basic dental services including extractions and fillings. Payment Options: PAYMENT IS DUE AT THE TIME OF SERVICE. Sliding scale, up to 50% off - bring proof if income or support. Medicaid with dental option accepted. Best way to get seen: Call to schedule an appointment, can usually be seen within 2 weeks OR they will try to see walk-ins - show up at 8a or 2p (you may have to wait).     [facility] [phone] [place] RESIDENTS ONLY   Location: [facility] [address], [place], [place] [address] Clinic Hours: By appointment only. Monday - Thursday 8am-5pm, Friday 8am-12pm Services: Cleanings, fillings, extractions. Payment Options: PAYMENT IS DUE AT THE TIME OF SERVICE. Cash, Visa or MasterCard. Sliding scale - $30 minimum per service. Best way to get seen: Come in to office, complete packet and make an appointment - need proof of income or support monies  for each household member and proof of [facility] residence. Usually takes about a month to  get in.     [facility] [phone]   Location: [address] [place] Clinic Hours: Walk-in Urgent Care Dental Services are offered Monday-Friday mornings only. The numbers of emergencies accepted daily is limited to the number of providers available. Maximum 15 - Mondays, Wednesdays & Thursdays Maximum 10 - Tuesdays & Fridays Services: You do not need to be a [facility] resident to be seen for a dental emergency. Emergencies are defined as pain, swelling, abnormal bleeding, or dental trauma. Walkins will receive x-rays if needed. NOTE: Dental cleaning is not an emergency. Payment Options: PAYMENT IS DUE AT THE TIME OF SERVICE. Minimum co-pay is $40.00 for uninsured patients. Minimum co-pay is $3.00 for Medicaid with dental coverage. Dental Insurance is accepted and must be presented at time of visit. Medicare does not cover dental. Forms of payment: Cash, credit card, checks. Best way to get seen: If not previously registered with the clinic, walk-in dental registration begins at 7:15 am and is on a first come/first serve basis. If previously registered with the clinic, call to make an appointment.     [facility] [place] ONLY   Location: [address] [place], [place] Clinic Hours: Mon-Thu 10a-2p Services: Extractions only! Payment Options: FREE (donations accepted) - bring proof of income or support Best way to get seen: Call and schedule an appointment OR come at 8am on the 1st Monday of every month (except for holidays) when it is first come/first served.     [facility] [phone]   Location: [place], [place] Clinic Hours: Friday mornings Services, Payment Options, Best way to get seen: Call for info

## 2019-01-10 NOTE — ED Provider Notes (Signed)
[facility] Emergency Department Provider Note  ____________________________________________   None    (approximate)  I have reviewed the triage vital signs and the nursing notes.   HISTORY  Chief Complaint Dental Pain    HPI Nicholas Thornton is a 53 y.o. male presents emergency department complaining of a dental abscess.  He states pain to the right lower jaw.  Symptoms for 3 days.  He denies any chest pain, shortness of breath, fever, chills, cough.  He states he noted he had a low-grade temp on arrival but does not know why he is having that.  He denies any known exposures to covid 19    History reviewed. No pertinent past medical history.  There are no active problems to display for this patient.   Past Surgical History:  Procedure Laterality Date  . HERNIA REPAIR      Prior to Admission medications   Medication Sig Start Date End Date Taking? Authorizing Provider  amoxicillin (AMOXIL) 500 MG capsule Take 1 capsule (500 mg total) by mouth 3 (three) times daily. 01/10/19   Johnathyn Viscomi, Roselyn Bering, PA-C  traMADol (ULTRAM) 50 MG tablet Take 1 tablet (50 mg total) by mouth every 6 (six) hours as needed. 01/10/19   Faythe Ghee, PA-C    Allergies Patient has no known allergies.  History reviewed. No pertinent family history.  Social History Social History   Tobacco Use  . Smoking status: Never Smoker  . Smokeless tobacco: Never Used  Substance Use Topics  . Alcohol use: Yes    Comment: twice a month  . Drug use: Never    Review of Systems  Constitutional: No fever/chills Eyes: No visual changes. ENT: No sore throat.  Positive dental pain and swelling Respiratory: Denies cough Genitourinary: Negative for dysuria. Musculoskeletal: Negative for back pain. Skin: Negative for rash.    ____________________________________________   PHYSICAL EXAM:  VITAL SIGNS: ED Triage Vitals  Enc Vitals Group     BP 01/10/19 1545 (!) 146/100   Pulse Rate 01/10/19 1545 (!) 107     Resp 01/10/19 1545 18     Temp 01/10/19 1545 99.6 F (37.6 C)     Temp Source 01/10/19 1545 Oral     SpO2 01/10/19 1545 98 %     Weight 01/10/19 1552 273 lb (123.8 kg)     Height 01/10/19 1552 6' 2.5" (1.892 m)     Head Circumference --      Peak Flow --      Pain Score 01/10/19 1552 10     Pain Loc --      Pain Edu? --      Excl. in [place]? --     Constitutional: Alert and oriented. Well appearing and in no acute distress. Eyes: Conjunctivae are normal.  Head: Atraumatic. Nose: No congestion/rhinnorhea. Mouth/Throat: Mucous membranes are moist.  Positive for swelling noted at the right lower molars.  Poor dentition is noted. Neck:  supple no lymphadenopathy noted Cardiovascular: Normal rate, regular rhythm. Heart sounds are normal Respiratory: Normal respiratory effort.  No retractions, lungs c t a  GU: deferred Musculoskeletal: FROM all extremities, warm and well perfused Neurologic:  Normal speech and language.  Skin:  Skin is warm, dry and intact. No rash noted. Psychiatric: Mood and affect are normal. Speech and behavior are normal.  ____________________________________________   LABS (all labs ordered are listed, but only abnormal results are displayed)  Labs Reviewed - No data to display ____________________________________________   ____________________________________________  RADIOLOGY    ____________________________________________   PROCEDURES  Procedure(s) performed: Amoxicillin 500 mg p.o., Toradol 30 mg IM   Procedures    ____________________________________________   INITIAL IMPRESSION / ASSESSMENT AND PLAN / ED COURSE  Pertinent labs & imaging results that were available during my care of the patient were reviewed by me and considered in my medical decision making (see chart for details).   Patient is a 53 year old male presents emergency department complaining of right sided dental pain and swelling.   Physical exam shows the right lower jaw has mild swelling along with the gum being swollen.  Positive poor dentition throughout.  Explained findings to the patient.  He was given an injection of Toradol 30 mg IM due to the level of pain.  Amoxicillin 500 mg p.o. while in the ED.  He was given a prescription for amoxicillin and tramadol.  He is to follow-up with a dentist of his choice.  He states he understands and will comply.  He was discharged in stable condition.     As part of my medical decision making, I reviewed the following data within the electronic MEDICAL RECORD NUMBER Nursing notes reviewed and incorporated, Old chart reviewed, Notes from prior ED visits and [place] Controlled Substance Database  ____________________________________________   FINAL CLINICAL IMPRESSION(S) / ED DIAGNOSES  Final diagnoses:  Dental abscess      NEW MEDICATIONS STARTED DURING THIS VISIT:  Discharge Medication List as of 01/10/2019  4:30 PM    START taking these medications   Details  amoxicillin (AMOXIL) 500 MG capsule Take 1 capsule (500 mg total) by mouth 3 (three) times daily., Starting Fri 01/10/2019, Normal    traMADol (ULTRAM) 50 MG tablet Take 1 tablet (50 mg total) by mouth every 6 (six) hours as needed., Starting Fri 01/10/2019, Normal         Note:  This document was prepared using Dragon voice recognition software and may include unintentional dictation errors.    Faythe Ghee, PA-C 01/10/19 1945    Don Perking, [place], MD 01/11/19 1539

## 2019-05-21 ENCOUNTER — Other Ambulatory Visit: Payer: Self-pay

## 2019-05-21 ENCOUNTER — Encounter: Payer: Self-pay | Admitting: Emergency Medicine

## 2019-05-21 ENCOUNTER — Emergency Department
Admission: EM | Admit: 2019-05-21 | Discharge: 2019-05-21 | Disposition: A | Discharge status: Home / Self Care | Payer: PRIVATE HEALTH INSURANCE | Attending: Emergency Medicine | Admitting: Emergency Medicine

## 2019-05-21 DIAGNOSIS — R51 Headache: Secondary | ICD-10-CM | POA: Diagnosis present

## 2019-05-21 DIAGNOSIS — R519 Headache, unspecified: Secondary | ICD-10-CM

## 2019-05-21 DIAGNOSIS — J0100 Acute maxillary sinusitis, unspecified: Secondary | ICD-10-CM | POA: Diagnosis not present

## 2019-05-21 MED ORDER — TRAMADOL HCL 50 MG PO TABS: 50.0000 mg | ORAL_TABLET | Freq: Four times a day (QID) | ORAL | 0 refills | Status: AC | PRN

## 2019-05-21 MED ORDER — AMOXICILLIN 500 MG PO CAPS: 500.0000 mg | ORAL_CAPSULE | Freq: Three times a day (TID) | ORAL | 0 refills | Status: DC

## 2019-05-21 MED ORDER — FEXOFENADINE-PSEUDOEPHED ER 60-120 MG PO TB12: 1.0000 | ORAL_TABLET | Freq: Two times a day (BID) | ORAL | 0 refills | Status: DC

## 2019-05-21 NOTE — ED Triage Notes (Signed)
Pt here with c/o headache for 2 days now, body aches, sinus pressure, denies fever or cough. NAD.

## 2019-05-21 NOTE — ED Provider Notes (Signed)
[facility] Emergency Department Provider Note   ____________________________________________   First MD Initiated Contact with Patient 05/21/19 1013     (approximate)  I have reviewed the triage vital signs and the nursing notes.   HISTORY  Chief Complaint Headache and Generalized Body Aches    HPI Nicholas Thornton is a 53 y.o. male patient presents with 10 days of nasal congestion and facial pain.  Complaint is worse in the past 2 days.  Patient also complained of frontal headache and body aches.  Patient denies recent travel or known contact with COVID-19.  Patient denies nausea, vomiting, diarrhea.  Rates his pain is 8/10.  Patient described the pain is "achy".  No palliative measure for complaint.         History reviewed. No pertinent past medical history.  There are no active problems to display for this patient.   Past Surgical History:  Procedure Laterality Date  . HERNIA REPAIR      Prior to Admission medications   Medication Sig Start Date End Date Taking? Authorizing Provider  amoxicillin (AMOXIL) 500 MG capsule Take 1 capsule (500 mg total) by mouth 3 (three) times daily. 05/21/19   Sable Feil, PA-C  fexofenadine-pseudoephedrine (ALLEGRA-D) 60-120 MG 12 hr tablet Take 1 tablet by mouth 2 (two) times daily. 05/21/19   Sable Feil, PA-C  traMADol (ULTRAM) 50 MG tablet Take 1 tablet (50 mg total) by mouth every 6 (six) hours as needed for up to 3 days. 05/21/19 05/24/19  Sable Feil, PA-C    Allergies Patient has no known allergies.  No family history on file.  Social History Social History   Tobacco Use  . Smoking status: Never Smoker  . Smokeless tobacco: Never Used  Substance Use Topics  . Alcohol use: Yes    Comment: twice a month  . Drug use: Never    Review of Systems Constitutional: No fever/chills Eyes: No visual changes. ENT: No sore throat.  Nasal congestion. Cardiovascular: Denies chest pain. Respiratory:  Denies shortness of breath. Gastrointestinal: No abdominal pain.  No nausea, no vomiting.  No diarrhea.  No constipation. Genitourinary: Negative for dysuria. Musculoskeletal: Negative for back pain. Skin: Negative for rash. Neurological: Negative for headaches, focal weakness or numbness.   ____________________________________________   PHYSICAL EXAM:  VITAL SIGNS: ED Triage Vitals [05/21/19 1010]  Enc Vitals Group     BP (!) 134/100     Pulse Rate 95     Resp 20     Temp 98.4 F (36.9 C)     Temp Source Oral     SpO2 94 %     Weight 280 lb (127 kg)     Height 6\' 3"  (1.905 m)     Head Circumference      Peak Flow      Pain Score 8     Pain Loc      Pain Edu?      Excl. in [place]?     Constitutional: Alert and oriented. Well appearing and in no acute distress. Eyes: Conjunctivae are normal. PERRL. EOMI. Head: Atraumatic. Nose: Bilateral edematous nasal turbinates or frontal maxillary guarding.   Mouth/Throat: Mucous membranes are moist.  Oropharynx non-erythematous.  Postnasal drainage. Neck: No stridor.   Hematological/Lymphatic/Immunilogical: No cervical lymphadenopathy. Cardiovascular: Normal rate, regular rhythm. Grossly normal heart sounds.  Good peripheral circulation.  Elevated blood pressure. Respiratory: Normal respiratory effort.  No retractions. Lungs CTAB. Musculoskeletal: No lower extremity tenderness nor edema.  No  joint effusions. Neurologic:  Normal speech and language. No gross focal neurologic deficits are appreciated. No gait instability. Skin:  Skin is warm, dry and intact. No rash noted. Psychiatric: Mood and affect are normal. Speech and behavior are normal.  ____________________________________________   LABS (all labs ordered are listed, but only abnormal results are displayed)  Labs Reviewed - No data to display ____________________________________________  EKG   ____________________________________________  RADIOLOGY  ED MD  interpretation:    Official radiology report(s): No results found.  ____________________________________________   PROCEDURES  Procedure(s) performed (including Critical Care):  Procedures   ____________________________________________   INITIAL IMPRESSION / ASSESSMENT AND PLAN / ED COURSE  As part of my medical decision making, I reviewed the following data within the electronic MEDICAL RECORD NUMBER         Patient presents with 2 weeks of nasal congestion and facial pain.  Physical exam is consistent with subacute maxillary sinusitis.  Patient given discharge care instruction advised take medication as directed.      ____________________________________________   FINAL CLINICAL IMPRESSION(S) / ED DIAGNOSES  Final diagnoses:  Subacute maxillary sinusitis  Sinus headache     ED Discharge Orders         Ordered    amoxicillin (AMOXIL) 500 MG capsule  3 times daily     05/21/19 1029    traMADol (ULTRAM) 50 MG tablet  Every 6 hours PRN     05/21/19 1029    fexofenadine-pseudoephedrine (ALLEGRA-D) 60-120 MG 12 hr tablet  2 times daily     05/21/19 1029           Note:  This document was prepared using Dragon voice recognition software and may include unintentional dictation errors.    Joni ReiningSmith, Shyra Emile K, PA-C 05/21/19 1030    Emily FilbertWilliams, Jonathan E, MD 05/21/19 [phone]

## 2019-11-26 ENCOUNTER — Emergency Department
Admission: EM | Admit: 2019-11-26 | Discharge: 2019-11-26 | Disposition: A | Discharge status: Home / Self Care | Payer: 59 | Attending: Emergency Medicine | Admitting: Emergency Medicine

## 2019-11-26 ENCOUNTER — Encounter: Payer: Self-pay | Admitting: Emergency Medicine

## 2019-11-26 ENCOUNTER — Other Ambulatory Visit: Payer: Self-pay

## 2019-11-26 DIAGNOSIS — J029 Acute pharyngitis, unspecified: Secondary | ICD-10-CM | POA: Diagnosis present

## 2019-11-26 DIAGNOSIS — J020 Streptococcal pharyngitis: Secondary | ICD-10-CM | POA: Diagnosis not present

## 2019-11-26 LAB — GROUP A STREP BY PCR: Group A Strep by PCR: DETECTED — AB

## 2019-11-26 MED ORDER — IBUPROFEN 600 MG PO TABS
600.0000 mg | ORAL_TABLET | Freq: Once | ORAL | Status: AC
  Administered 2019-11-26: 600 mg via ORAL
  Filled 2019-11-26: qty 1

## 2019-11-26 MED ORDER — IBUPROFEN 800 MG PO TABS: 800.0000 mg | ORAL_TABLET | Freq: Three times a day (TID) | ORAL | 0 refills | Status: DC | PRN

## 2019-11-26 MED ORDER — AMOXICILLIN 500 MG PO TABS: 500.0000 mg | ORAL_TABLET | Freq: Two times a day (BID) | ORAL | 0 refills | Status: AC

## 2019-11-26 MED ORDER — AMOXICILLIN 500 MG PO CAPS
500.0000 mg | ORAL_CAPSULE | Freq: Once | ORAL | Status: AC
Start: 2019-11-26 — End: 2019-11-26
  Administered 2019-11-26: 06:00:00 500 mg via ORAL
  Filled 2019-11-26: qty 1

## 2019-11-26 NOTE — ED Provider Notes (Signed)
[facility] Emergency Department Provider Note  ____________________________________________  Time seen: Approximately 5:55 AM  I have reviewed the triage vital signs and the nursing notes.   HISTORY  Chief Complaint Sore Throat   HPI Nicholas Thornton is a 54 y.o. male who presents for evaluation of sore throat.  Patient reports that the pain started 2 days ago, worse with swallowing, sharp, moderate in intensity.  Had a low-grade fever at work but took some Tylenol.  No chest pain or shortness of breath, no loss of taste or smell, no vomiting or diarrhea, no known exposures to Covid, no difficulty opening his mouth.   PMH Obesity  Past Surgical History:  Procedure Laterality Date  . HERNIA REPAIR      Prior to Admission medications   Medication Sig Start Date End Date Taking? Authorizing Provider  amoxicillin (AMOXIL) 500 MG tablet Take 1 tablet (500 mg total) by mouth 2 (two) times daily for 10 days. 11/26/19 12/06/19  Rudene Re, MD  fexofenadine-pseudoephedrine (ALLEGRA-D) 60-120 MG 12 hr tablet Take 1 tablet by mouth 2 (two) times daily. 05/21/19   Sable Feil, PA-C  ibuprofen (ADVIL) 800 MG tablet Take 1 tablet (800 mg total) by mouth every 8 (eight) hours as needed. 11/26/19   Rudene Re, MD    Allergies Patient has no known allergies.  History reviewed. No pertinent family history.  Social History Social History   Tobacco Use  . Smoking status: Never Smoker  . Smokeless tobacco: Never Used  Substance Use Topics  . Alcohol use: Yes    Comment: twice a month  . Drug use: Never    Review of Systems  Constitutional: + fever. Eyes: Negative for visual changes. ENT: + sore throat. Neck: No neck pain  Cardiovascular: Negative for chest pain. Respiratory: Negative for shortness of breath. Gastrointestinal: Negative for abdominal pain, vomiting or diarrhea. Genitourinary: Negative for dysuria. Musculoskeletal: Negative  for back pain. Skin: Negative for rash. Neurological: Negative for headaches, weakness or numbness. Psych: No SI or HI  ____________________________________________   PHYSICAL EXAM:  VITAL SIGNS: ED Triage Vitals  Enc Vitals Group     BP 11/26/19 0433 139/86     Pulse Rate 11/26/19 0433 (!) 130     Resp 11/26/19 0433 16     Temp 11/26/19 0433 99.9 F (37.7 C)     Temp Source 11/26/19 0433 Oral     SpO2 11/26/19 0433 95 %     Weight 11/26/19 0433 273 lb (123.8 kg)     Height 11/26/19 0433 6\' 2"  (1.88 m)     Head Circumference --      Peak Flow --      Pain Score 11/26/19 0432 9     Pain Loc --      Pain Edu? --      Excl. in [place]? --     Constitutional: Alert and oriented. Well appearing and in no apparent distress. HEENT:      Head: Normocephalic and atraumatic.         Eyes: Conjunctivae are normal. Sclera is non-icteric.       Mouth/Throat: Mucous membranes are moist.  Bilateral tonsils are swollen and erythematous with no exudate, no peritonsillar abscess, no stridor, airways patent, floor of the mouth is soft      Neck: Supple with no signs of meningismus. Cardiovascular: Tachycardic with regular rhythm. No murmurs, gallops, or rubs. 2+ symmetrical distal pulses are present in all extremities. Respiratory: Normal respiratory  effort. Lungs are clear to auscultation bilaterally. No wheezes, crackles, or rhonchi.  Musculoskeletal: No edema, cyanosis, or erythema of extremities. Neurologic: Normal speech and language. Face is symmetric. Moving all extremities. No gross focal neurologic deficits are appreciated. Skin: Skin is warm, dry and intact. No rash noted. Psychiatric: Mood and affect are normal. Speech and behavior are normal.  ____________________________________________   LABS (all labs ordered are listed, but only abnormal results are displayed)  Labs Reviewed  GROUP A STREP BY PCR - Abnormal; Notable for the following components:      Result Value   Group A  Strep by PCR DETECTED (*)    All other components within normal limits   ____________________________________________  EKG  none  ____________________________________________  RADIOLOGY  none  ____________________________________________   PROCEDURES  Procedure(s) performed: None Procedures Critical Care performed:  None ____________________________________________   INITIAL IMPRESSION / ASSESSMENT AND PLAN / ED COURSE  54 y.o. male who presents for evaluation of sore throat.  Tonsils are swollen and erythematous, strep test is positive, no signs of peritonsillar abscess.  Patient was started on amoxicillin.  Initially tachycardic with a low-grade fever however heart rate trending down.  Review of epic shows that patient is always slightly tachycardic in prior visits.       As part of my medical decision making, I reviewed the following data within the electronic MEDICAL RECORD NUMBER Nursing notes reviewed and incorporated, Labs reviewed , Old chart reviewed, Notes from prior ED visits and [place] Controlled Substance Database   Please note:  Patient was evaluated in Emergency Department today for the symptoms described in the history of present illness. Patient was evaluated in the context of the global COVID-19 pandemic, which necessitated consideration that the patient might be at risk for infection with the SARS-CoV-2 virus that causes COVID-19. Institutional protocols and algorithms that pertain to the evaluation of patients at risk for COVID-19 are in a state of rapid change based on information released by regulatory bodies including the CDC and federal and state organizations. These policies and algorithms were followed during the patient's care in the ED.  Some ED evaluations and interventions may be delayed as a result of limited staffing during the pandemic.   ____________________________________________   FINAL CLINICAL IMPRESSION(S) / ED DIAGNOSES   Final diagnoses:    Strep pharyngitis      NEW MEDICATIONS STARTED DURING THIS VISIT:  ED Discharge Orders         Ordered    amoxicillin (AMOXIL) 500 MG tablet  2 times daily     11/26/19 0558    ibuprofen (ADVIL) 800 MG tablet  Every 8 hours PRN     11/26/19 0558           Note:  This document was prepared using Dragon voice recognition software and may include unintentional dictation errors.    Don Perking, [place], MD 11/26/19 [phone]

## 2019-11-26 NOTE — ED Triage Notes (Signed)
Patient ambulatory to triage with steady gait, without difficulty or distress noted, mask in place; pt reports sore throat & body aches since yesterday; tylenol taken at 1am

## 2020-10-23 ENCOUNTER — Other Ambulatory Visit: Payer: Self-pay

## 2020-10-23 ENCOUNTER — Encounter: Payer: Self-pay | Admitting: Intensive Care

## 2020-10-23 ENCOUNTER — Emergency Department
Admission: EM | Admit: 2020-10-23 | Discharge: 2020-10-23 | Disposition: A | Discharge status: Home / Self Care | Payer: HRSA Program | Attending: Emergency Medicine | Admitting: Emergency Medicine

## 2020-10-23 DIAGNOSIS — R000 Tachycardia, unspecified: Secondary | ICD-10-CM | POA: Diagnosis not present

## 2020-10-23 DIAGNOSIS — J069 Acute upper respiratory infection, unspecified: Secondary | ICD-10-CM

## 2020-10-23 DIAGNOSIS — U071 COVID-19: Secondary | ICD-10-CM | POA: Insufficient documentation

## 2020-10-23 DIAGNOSIS — R0981 Nasal congestion: Secondary | ICD-10-CM | POA: Diagnosis present

## 2020-10-23 NOTE — ED Provider Notes (Signed)
[facility] Emergency Department Provider Note   ____________________________________________    I have reviewed the triage vital signs and the nursing notes.   HISTORY  Chief Complaint Nasal Congestion and Generalized Body Aches     HPI Nicholas Thornton is a 55 y.o. male who presents with nasal congestion, generalized body aches, fatigue that started 3 days ago.  He is not vaccinated against COVID-19.  He denies shortness of breath or cough at this time.  Does report chills.  Does not take anything for this  History reviewed. No pertinent past medical history.  There are no problems to display for this patient.   Past Surgical History:  Procedure Laterality Date  . HERNIA REPAIR      Prior to Admission medications   Medication Sig Start Date End Date Taking? Authorizing Provider  fexofenadine-pseudoephedrine (ALLEGRA-D) 60-120 MG 12 hr tablet Take 1 tablet by mouth 2 (two) times daily. 05/21/19   Joni Reining, PA-C  ibuprofen (ADVIL) 800 MG tablet Take 1 tablet (800 mg total) by mouth every 8 (eight) hours as needed. 11/26/19   Nita Sickle, MD     Allergies Patient has no known allergies.  History reviewed. No pertinent family history.  Social History Social History   Tobacco Use  . Smoking status: Never Smoker  . Smokeless tobacco: Never Used  Substance Use Topics  . Alcohol use: Yes    Comment: twice a month  . Drug use: Never    Review of Systems  Constitutional: As above  ENT: No sore throat.  Positive congestion   Gastrointestinal: No abdominal pain.  No nausea, no vomiting.   Genitourinary: Negative for dysuria. Musculoskeletal: Myalgias Skin: Negative for rash. Neurological: Headaches    ____________________________________________   PHYSICAL EXAM:  VITAL SIGNS: ED Triage Vitals  Enc Vitals Group     BP 10/23/20 1345 120/85     Pulse Rate 10/23/20 1345 (!) 115     Resp 10/23/20 1345 20     Temp  10/23/20 1345 98.9 F (37.2 C)     Temp src --      SpO2 10/23/20 1345 97 %     Weight 10/23/20 1401 127 kg (280 lb)     Height 10/23/20 1401 1.905 m (6\' 3" )     Head Circumference --      Peak Flow --      Pain Score 10/23/20 1400 8     Pain Loc --      Pain Edu? --      Excl. in [place]? --      Constitutional: Alert and oriented.   Nose: Positive congestion Mouth/Throat: Mucous membranes are moist.   Cardiovascular: Normal rate, regular rhythm.  Respiratory: Normal respiratory effort.  No retractions.  Musculoskeletal: No lower extremity tenderness nor edema.   Neurologic:  Normal speech and language. No gross focal neurologic deficits are appreciated.   Skin:  Skin is warm, dry and intact. No rash noted.   ____________________________________________   LABS (all labs ordered are listed, but only abnormal results are displayed)  Labs Reviewed  SARS CORONAVIRUS 2 (TAT 6-24 HRS)   ____________________________________________  EKG   ____________________________________________  RADIOLOGY   ____________________________________________   PROCEDURES  Procedure(s) performed: No  Procedures   Critical Care performed: No ____________________________________________   INITIAL IMPRESSION / ASSESSMENT AND PLAN / ED COURSE  Pertinent labs & imaging results that were available during my care of the patient were reviewed by me and considered in  my medical decision making (see chart for details).  Patient presents with symptoms consistent with viral illness, given current pandemic almost certainly related to COVID-19.  Patient has mild tachycardia on exam related to viral symptoms but is overall well-appearing and in no acute distress  We will send Covid swab, recommend quarantine until results, outpatient follow-up for possible Mab infusion, return precautions discussed   ____________________________________________   FINAL CLINICAL IMPRESSION(S) / ED  DIAGNOSES  Final diagnoses:  Upper respiratory tract infection, unspecified type      NEW MEDICATIONS STARTED DURING THIS VISIT:  Discharge Medication List as of 10/23/2020  1:34 PM       Note:  This document was prepared using Dragon voice recognition software and may include unintentional dictation errors.   Jene Every, MD 10/23/20 1535

## 2020-10-23 NOTE — ED Triage Notes (Signed)
Patient presents with head congestion, body aches, and diarrhea that started wednesday

## 2020-10-24 LAB — SARS CORONAVIRUS 2 (TAT 6-24 HRS): SARS Coronavirus 2: POSITIVE — AB

## 2020-10-25 ENCOUNTER — Telehealth: Payer: Self-pay | Admitting: Unknown Physician Specialty

## 2020-10-25 ENCOUNTER — Encounter: Payer: Self-pay | Admitting: Unknown Physician Specialty

## 2020-10-25 NOTE — Telephone Encounter (Signed)
Called to Discuss with patient about Covid symptoms and the use of the monoclonal antibody infusion for those with mild to moderate Covid symptoms and at a high risk of hospitalization.     Pt appears to qualify for this infusion due to co-morbid conditions and/or a member of an at-risk group in accordance with the FDA Emergency Use Authorization.    Unable to reach pt   Symptom onset: 1/4 Vaccinated: no Qualified for Infusion: yes  Unable to leave message

## 2020-10-28 ENCOUNTER — Other Ambulatory Visit (INDEPENDENT_AMBULATORY_CARE_PROVIDER_SITE_OTHER): Payer: Self-pay | Admitting: Physician Assistant

## 2020-11-02 ENCOUNTER — Other Ambulatory Visit: Payer: Self-pay | Admitting: Physician Assistant

## 2020-11-05 MED ORDER — FEXOFENADINE-PSEUDOEPHED ER 60-120 MG PO TB12: 1.0000 | ORAL_TABLET | Freq: Two times a day (BID) | ORAL | 0 refills | Status: DC

## 2023-02-07 ENCOUNTER — Encounter: Payer: Self-pay | Admitting: Emergency Medicine

## 2023-02-07 ENCOUNTER — Other Ambulatory Visit: Payer: Self-pay

## 2023-02-07 ENCOUNTER — Emergency Department
Admission: EM | Admit: 2023-02-07 | Discharge: 2023-02-07 | Disposition: A | Discharge status: Home / Self Care | Payer: Self-pay | Attending: Emergency Medicine | Admitting: Emergency Medicine

## 2023-02-07 DIAGNOSIS — R197 Diarrhea, unspecified: Secondary | ICD-10-CM | POA: Diagnosis not present

## 2023-02-07 DIAGNOSIS — M7918 Myalgia, other site: Secondary | ICD-10-CM | POA: Insufficient documentation

## 2023-02-07 DIAGNOSIS — R519 Headache, unspecified: Secondary | ICD-10-CM | POA: Insufficient documentation

## 2023-02-07 DIAGNOSIS — R1110 Vomiting, unspecified: Secondary | ICD-10-CM | POA: Diagnosis not present

## 2023-02-07 DIAGNOSIS — R1013 Epigastric pain: Secondary | ICD-10-CM | POA: Insufficient documentation

## 2023-02-07 LAB — COMPREHENSIVE METABOLIC PANEL
ALT: 10 U/L (ref 0–44)
AST: 24 U/L (ref 15–41)
Albumin: 3.8 g/dL (ref 3.5–5.0)
Alkaline Phosphatase: 63 U/L (ref 38–126)
Anion gap: 8 (ref 5–15)
BUN: 9 mg/dL (ref 6–20)
CO2: 23 mmol/L (ref 22–32)
Calcium: 9.1 mg/dL (ref 8.9–10.3)
Chloride: 103 mmol/L (ref 98–111)
Creatinine, Ser: 0.91 mg/dL (ref 0.61–1.24)
GFR, Estimated: >60 mL/min (ref >60)
Glucose, Bld: 98 mg/dL (ref 70–99)
Potassium: 4.1 mmol/L (ref 3.5–5.1)
Sodium: 134 mmol/L — ABNORMAL LOW (ref 135–145)
Total Bilirubin: 0.9 mg/dL (ref 0.3–1.2)
Total Protein: 7.9 g/dL (ref 6.5–8.1)

## 2023-02-07 LAB — URINALYSIS, ROUTINE W REFLEX MICROSCOPIC
Bilirubin Urine: NEGATIVE
Glucose, UA: NEGATIVE mg/dL
Ketones, ur: NEGATIVE mg/dL
Leukocytes,Ua: NEGATIVE
Nitrite: NEGATIVE
Protein, ur: NEGATIVE mg/dL
Specific Gravity, Urine: 1.013 (ref 1.005–1.030)
Squamous Epithelial / HPF: NONE SEEN /HPF (ref 0–5)
pH: 6 (ref 5.0–8.0)

## 2023-02-07 LAB — CBC WITH DIFFERENTIAL/PLATELET
Abs Immature Granulocytes: 0.05 10*3/uL (ref 0.00–0.07)
Basophils Absolute: 0 10*3/uL (ref 0.0–0.1)
Basophils Relative: 0 %
Eosinophils Absolute: 0.2 10*3/uL (ref 0.0–0.5)
Eosinophils Relative: 2 %
HCT: 48.1 % (ref 39.0–52.0)
Hemoglobin: 15.8 g/dL (ref 13.0–17.0)
Immature Granulocytes: 1 %
Lymphocytes Relative: 37 %
Lymphs Abs: 3.7 10*3/uL (ref 0.7–4.0)
MCH: 28 pg (ref 26.0–34.0)
MCHC: 32.8 g/dL (ref 30.0–36.0)
MCV: 85.3 fL (ref 80.0–100.0)
Monocytes Absolute: 0.8 10*3/uL (ref 0.1–1.0)
Monocytes Relative: 8 %
Neutro Abs: 5.2 10*3/uL (ref 1.7–7.7)
Neutrophils Relative %: 52 %
Platelets: 389 10*3/uL (ref 150–400)
RBC: 5.64 MIL/uL (ref 4.22–5.81)
RDW: 13.8 % (ref 11.5–15.5)
WBC: 10 10*3/uL (ref 4.0–10.5)
nRBC: 0 % (ref 0.0–0.2)

## 2023-02-07 LAB — LIPASE, BLOOD: Lipase: 33 U/L (ref 11–51)

## 2023-02-07 MED ORDER — ONDANSETRON 4 MG PO TBDP: 4.0000 mg | ORAL_TABLET | Freq: Three times a day (TID) | ORAL | 0 refills | Status: DC | PRN

## 2023-02-07 MED ORDER — KETOROLAC TROMETHAMINE 15 MG/ML IJ SOLN
INTRAMUSCULAR | Status: AC
  Filled 2023-02-07: qty 1

## 2023-02-07 MED ORDER — KETOROLAC TROMETHAMINE 30 MG/ML IJ SOLN
15.0000 mg | Freq: Once | INTRAMUSCULAR | Status: AC
  Administered 2023-02-07: 15 mg via INTRAVENOUS

## 2023-02-07 MED ORDER — SODIUM CHLORIDE 0.9 % IV BOLUS
1000.0000 mL | Freq: Once | INTRAVENOUS | Status: AC
  Administered 2023-02-07: 1000 mL via INTRAVENOUS

## 2023-02-07 MED ORDER — ONDANSETRON HCL 4 MG/2ML IJ SOLN
INTRAMUSCULAR | Status: AC
  Administered 2023-02-07: 4 mg via INTRAVENOUS
  Filled 2023-02-07: qty 2

## 2023-02-07 MED ORDER — ONDANSETRON HCL 4 MG/2ML IJ SOLN: 4.0000 mg | Freq: Once | INTRAMUSCULAR | Status: AC

## 2023-02-07 NOTE — ED Notes (Signed)
Waiting on ivf to finish infusing before discharge.

## 2023-02-07 NOTE — Discharge Instructions (Signed)
Your blood work and urine sample are reassuring.  I suspect that you have a viral illness causing your vomiting and diarrhea.  You can take the Zofran as needed for nausea.  Please make sure you are drinking you do not necessarily need to be eating.  If your abdominal pain is worsening or you are not able to keep fluids down then please return to the emergency department.

## 2023-02-07 NOTE — ED Notes (Signed)
Pt unable to give urine sample at this time.  Has specimen cup and is aware of the need for a urine sample

## 2023-02-07 NOTE — ED Provider Notes (Signed)
[facility] Provider Note    Event Date/Time   First MD Initiated Contact with Patient 02/07/23 0700     (approximate)   History   Abdominal Pain   HPI  Nicholas Thornton is a 57 y.o. male with no significant past medical history who presents with vomiting diarrhea headache and bodyaches.  Symptoms started on Sunday, 3 days ago.  Initially started with chills and emesis.  He vomited once this morning does not feel like there is anything else to throw up.  Has about 4 episodes of nonbloody diarrhea per day as well.  He also endorses some epigastric discomfort when he is about to vomit but is not having much abdominal pain between episodes.  He is making normal urine and denies dysuria.  Has had chills but no measured fevers.  No cough congestion dyspnea.  Patient also endorses headache describes about 7 out of 10 bitemporal without associated vision change numbness tingling or weakness.  Also endorsing myalgias.  No sick contacts.  Not able to go to work yesterday and is requesting a work note.     History reviewed. No pertinent past medical history.  There are no problems to display for this patient.    Physical Exam  Triage Vital Signs: ED Triage Vitals  Enc Vitals Group     BP 02/07/23 0548 (!) 120/92     Pulse Rate 02/07/23 0548 91     Resp 02/07/23 0548 18     Temp 02/07/23 0548 98.4 F (36.9 C)     Temp Source 02/07/23 0548 Oral     SpO2 02/07/23 0548 98 %     Weight 02/07/23 0548 (!) 324 lb (147 kg)     Height 02/07/23 0548  (1.88 m)     Head Circumference --      Peak Flow --      Pain Score 02/07/23 0547 8     Pain Loc --      Pain Edu? --      Excl. in GC? --     Most recent vital signs: Vitals:   02/07/23 0548  BP: (!) 120/92  Pulse: 91  Resp: 18  Temp: 98.4 F (36.9 C)  SpO2: 98%     General: Awake, no distress.  CV:  Good peripheral perfusion.  Resp:  Normal effort.  Lung sounds are clear Abd:  No distention.  Abdomen  is obese mild diffuse tenderness but no guarding abdomen soft Neuro:             Awake, Alert, Oriented x 3  Other:  Aox3, nml speech  PERRL, EOMI, face symmetric, nml tongue movement  5/5 strength in the BL upper and lower extremities  Sensation grossly intact in the BL upper and lower extremities  Finger-nose-finger intact BL    ED Results / Procedures / Treatments  Labs (all labs ordered are listed, but only abnormal results are displayed) Labs Reviewed  COMPREHENSIVE METABOLIC PANEL - Abnormal; Notable for the following components:      Result Value   Sodium 134 (*)    All other components within normal limits  URINALYSIS, ROUTINE W REFLEX MICROSCOPIC - Abnormal; Notable for the following components:   Color, Urine YELLOW (*)    APPearance CLEAR (*)    Hgb urine dipstick MODERATE (*)    Bacteria, UA RARE (*)    All other components within normal limits  CBC WITH DIFFERENTIAL/PLATELET  LIPASE, BLOOD     EKG  RADIOLOGY    PROCEDURES:  Critical Care performed: No  Procedures  The patient is on the cardiac monitor to evaluate for evidence of arrhythmia and/or significant heart rate changes.   MEDICATIONS ORDERED IN ED: Medications  sodium chloride 0.9 % bolus 1,000 mL (1,000 mLs Intravenous New Bag/Given 02/07/23 0716)  ondansetron (ZOFRAN) injection 4 mg (4 mg Intravenous Given 02/07/23 0716)  ketorolac (TORADOL) 30 MG/ML injection 15 mg (15 mg Intravenous Given 02/07/23 0716)     IMPRESSION / MDM / ASSESSMENT AND PLAN / ED COURSE  I reviewed the triage vital signs and the nursing notes.                              Patient's presentation is most consistent with acute complicated illness / injury requiring diagnostic workup.  Differential diagnosis includes, but is not limited to, viral illness including viral gastroenteritis, COVID, influenza, electrolyte abnormality, AKI, hypovolemia, pancreatitis, hepatitis, biliary colic  The patient is a  57 year old male who presents with headache vomiting diarrhea chills myalgias.  His symptoms been going on for about 3 days.  He stated elevated drink but not eating much.  Having about 4 episodes of nonbloody diarrhea per day has vomited once today vomiting seems to be slowing down.  He does endorse abdominal pain but it is mainly when he needs to vomit and not having abdominal pain in between.  He also endorses headache but is not associated with vision changes numbness tingling weakness and is associated with generalized myalgias.  Patient's vital signs are relatively unremarkable.  He is afebrile normal heart rate and O2 sat.  Overall he looks well on exam.  Abdominal exam does have some diffuse tenderness and his obesity makes exam more difficult but his abdomen is soft and there is not any focal tenderness.  Neurologic exam performed given headache is unremarkable.  His constellation of symptoms is most suspicious for a viral illness.  Plan to check labs including his LFTs lipase and electrolytes given the multiple days of vomiting and abdominal pain.  We will rehydrate with a bolus of normal saline and give Zofran and Toradol.  Plan to repeat abdominal exam to assess whether he needs imaging.  Do not feel he needs cross-sectional imaging at this time.  After Toradol and Zofran bolus fluid patient is feeling improved.  Headache and abdominal pain have resolved.  On repeat abdominal exam he has no tenderness.  He did tolerate a p.o. challenge.  We discussed likely viral illness and to maintain hydration.  Discussed return precautions for worsening abdominal pain.  Work note provided.       FINAL CLINICAL IMPRESSION(S) / ED DIAGNOSES   Final diagnoses:  Vomiting and diarrhea     Rx / DC Orders   ED Discharge Orders          Ordered    ondansetron (ZOFRAN-ODT) 4 MG disintegrating tablet  Every 8 hours PRN        02/07/23 0811             Note:  This document was prepared using  Dragon voice recognition software and may include unintentional dictation errors.   Georga Hacking, MD 02/07/23 [id number]

## 2023-02-07 NOTE — ED Triage Notes (Signed)
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

## 2023-04-10 ENCOUNTER — Encounter: Payer: Self-pay | Admitting: Emergency Medicine

## 2023-04-10 ENCOUNTER — Emergency Department: Payer: PRIVATE HEALTH INSURANCE

## 2023-04-10 ENCOUNTER — Other Ambulatory Visit: Payer: Self-pay

## 2023-04-10 ENCOUNTER — Emergency Department
Admission: EM | Admit: 2023-04-10 | Discharge: 2023-04-10 | Disposition: A | Discharge status: Home / Self Care | Payer: PRIVATE HEALTH INSURANCE | Attending: Emergency Medicine | Admitting: Emergency Medicine

## 2023-04-10 DIAGNOSIS — J069 Acute upper respiratory infection, unspecified: Secondary | ICD-10-CM | POA: Diagnosis not present

## 2023-04-10 DIAGNOSIS — Z20822 Contact with and (suspected) exposure to covid-19: Secondary | ICD-10-CM | POA: Diagnosis not present

## 2023-04-10 DIAGNOSIS — R030 Elevated blood-pressure reading, without diagnosis of hypertension: Secondary | ICD-10-CM | POA: Insufficient documentation

## 2023-04-10 DIAGNOSIS — R059 Cough, unspecified: Secondary | ICD-10-CM | POA: Diagnosis present

## 2023-04-10 LAB — SARS CORONAVIRUS 2 BY RT PCR: SARS Coronavirus 2 by RT PCR: NEGATIVE

## 2023-04-10 MED ORDER — PSEUDOEPH-BROMPHEN-DM 30-2-10 MG/5ML PO SYRP: 5.0000 mL | ORAL_SOLUTION | Freq: Four times a day (QID) | ORAL | 0 refills | Status: DC | PRN

## 2023-04-10 MED ORDER — FLUTICASONE PROPIONATE 50 MCG/ACT NA SUSP: 2.0000 | Freq: Every day | NASAL | 1 refills | Status: AC

## 2023-04-10 NOTE — ED Provider Notes (Signed)
[facility] Provider Note    Event Date/Time   First MD Initiated Contact with Patient 04/10/23 0720     (approximate)   History   Nasal Congestion   HPI  Nicholas Thornton is a 57 y.o. male presents to the ED with complaint of cough, nasal congestion, wheezing for the last 2 days.  Patient is unaware of any known fever or known sick contacts.  He reports chills associated with going in and out of of a air conditioned workplace.     Physical Exam   Triage Vital Signs: ED Triage Vitals  Enc Vitals Group     BP 04/10/23 0712 (!) 123/107     Pulse Rate 04/10/23 0712 100     Resp 04/10/23 0712 18     Temp 04/10/23 0712 98.2 F (36.8 C)     Temp Source 04/10/23 0712 Oral     SpO2 04/10/23 0712 97 %     Weight 04/10/23 0741 (!) 324 lb 1.2 oz (147 kg)     Height 04/10/23 0741 6\' 2"  (1.88 m)     Head Circumference --      Peak Flow --      Pain Score 04/10/23 0712 0     Pain Loc --      Pain Edu? --      Excl. in GC? --     Most recent vital signs: Vitals:   04/10/23 0712 04/10/23 0855  BP: (!) 123/107 (!) 129/96  Pulse: 100 91  Resp: 18   Temp: 98.2 F (36.8 C)   SpO2: 97% 96%     General: Awake, no distress.  Occasional cough noted during exam. CV:  Good peripheral perfusion.  Heart regular rate and rhythm. Resp:  Normal effort.  Lungs are clear bilaterally.  No wheezes noted at this time. Abd:  No distention.  Other:  EACs are clear.  TMs are dull bilaterally but no erythema or injection is noted.  Neck is supple without cervical lymphadenopathy.  Posterior pharynx with mild posterior drainage.  Uvula is midline.  Oral mucosa moist.   ED Results / Procedures / Treatments   Labs (all labs ordered are listed, but only abnormal results are displayed) Labs Reviewed  SARS CORONAVIRUS 2 BY RT PCR     RADIOLOGY Chest x-ray images were reviewed and interpreted by myself independent of the radiologist and is negative for  infiltrate    PROCEDURES:  Critical Care performed:   Procedures   MEDICATIONS ORDERED IN ED: Medications - No data to display   IMPRESSION / MDM / ASSESSMENT AND PLAN / ED COURSE  I reviewed the triage vital signs and the nursing notes.   Differential diagnosis includes, but is not limited to, COVID, bronchitis, asthma, pneumonia, viral URI, sinusitis.  Elevated blood pressure.  57 year old male presents to the ED with complaint of cough and nasal congestion for 2 days without relief of over-the-counter medications.  Patient is to discontinue using the Afrin nose spray that he has been using.  Blood pressure was elevated which may be a result of the epinephrine or possibly hypertension that has not been diagnosed.  He is strongly encouraged to follow-up with his PCP for recheck of his blood pressure.  COVID test was reassuring and chest x-ray did not acute cardiopulmonary changes.  A prescription for Bromfed-DM was sent to the pharmacy for him to begin taking and he has instructed to discontinue taking the Mucinex along with the Afrin nose spray.  Increase fluids to stay hydrated.  A prescription for Flonase nasal spray.  Tylenol or ibuprofen as needed.        Patient's presentation is most consistent with acute complicated illness / injury requiring diagnostic workup.  FINAL CLINICAL IMPRESSION(S) / ED DIAGNOSES   Final diagnoses:  Viral URI with cough  Elevated blood pressure reading     Rx / DC Orders   ED Discharge Orders          Ordered    fluticasone (FLONASE) 50 MCG/ACT nasal spray  Daily        04/10/23 0904    brompheniramine-pseudoephedrine-DM 30-2-10 MG/5ML syrup  4 times daily PRN        04/10/23 8119             Note:  This document was prepared using Dragon voice recognition software and may include unintentional dictation errors.   Tommi Rumps, PA-C 04/10/23 1478    Jene Every, MD 04/10/23 1014

## 2023-04-10 NOTE — ED Triage Notes (Signed)
Patient to ED via POV for nasal congestion and chest congestion x2 days. Denies chest pain. States has been taking decongestant at home with no relief. Also states cough.

## 2023-04-10 NOTE — Discharge Instructions (Signed)
Follow-up with your primary care provider if any continued problems or concerns.  Discontinue using the nasal spray that you have with you as it can cause elevated blood pressure as well and has other side effects which may make your symptoms worse.  Discontinue taking the Mucinex.  A prescription for Flonase nasal spray 2 sprays into each nostril once daily for the next 7 to 10 days.  Bromfed-DM as needed for cough and congestion.  Continue to stay hydrated with fluids of your choice.  You may also take Tylenol or ibuprofen if needed for headache, muscle aches or any fever. Have your blood pressure rechecked with your primary care provider as it was elevated in the emergency department.

## 2023-11-26 ENCOUNTER — Ambulatory Visit: Payer: Medicaid Other | Admitting: Podiatry

## 2023-12-10 ENCOUNTER — Ambulatory Visit (INDEPENDENT_AMBULATORY_CARE_PROVIDER_SITE_OTHER): Payer: PRIVATE HEALTH INSURANCE | Admitting: Podiatry

## 2023-12-10 ENCOUNTER — Encounter: Payer: Self-pay | Admitting: Podiatry

## 2023-12-10 VITALS — Ht 74.0 in | Wt 324.0 lb

## 2023-12-10 DIAGNOSIS — B351 Tinea unguium: Secondary | ICD-10-CM

## 2023-12-10 DIAGNOSIS — M79675 Pain in left toe(s): Secondary | ICD-10-CM

## 2023-12-10 DIAGNOSIS — M79674 Pain in right toe(s): Secondary | ICD-10-CM | POA: Diagnosis not present

## 2023-12-10 DIAGNOSIS — L603 Nail dystrophy: Secondary | ICD-10-CM | POA: Diagnosis not present

## 2023-12-10 NOTE — Progress Notes (Unsigned)
      Chief Complaint  Patient presents with   Nail Problem    " I have tried over the counter products for my nails and its not helping and they are brittle and discolored and I want to get them checked out and I also wear steel toed shoes and I am not sure if they are causing the problem"   HPI: 58 y.o. male presents today with concern of possible nail fungus.  Notes his nails are thick and discolored and he would like to try to resolve this.  States he has tried multiple over-the-counter products with no improvement.  Notes that he wears steel toe boots for work and feels that this aggravates the toenail issue.  History reviewed. No pertinent past medical history.  Past Surgical History:  Procedure Laterality Date   HERNIA REPAIR     No Known Allergies   Physical Exam: There are palpable pedal pulses bilateral.  The nails are 3 to 4 mm thick with yellow and brown discoloration, subungual debris, distal onycholysis, and pain with compression.  Interspaces are clear.  Epicritic sensation is intact.  Assessment/Plan of Care: 1. Mycotic toenails   2. Nail dystrophy    Discussed clinical findings with patient today.  The mycotic nails were debrided x 10 with sterile nail nippers and a power debriding bur.  Clippings of the hallux nails were collected and sent to [facility] laboratories for fungal nail culture.  The order was placed today.  Patient states that he had recent blood work performed by a provider that is not within our network.  If we need to place him on oral medication we may need to have his PCP fax the hepatic panel, or CMP, to our office for review.  I will contact the patient within the next 3 weeks to review the fungal nail culture results and discuss treatment options.  Will need to follow-up with patient approximately 3 months after he begins treatment of the toenails.   Clerance Lav, DPM, FACFAS [facility]     [address] [place], [place] [address]                Office [phone]  Fax [phone]

## 2023-12-11 ENCOUNTER — Encounter: Payer: Self-pay | Admitting: Podiatry

## 2023-12-11 DIAGNOSIS — B351 Tinea unguium: Secondary | ICD-10-CM | POA: Insufficient documentation

## 2023-12-24 ENCOUNTER — Other Ambulatory Visit: Payer: Self-pay | Admitting: Podiatry

## 2023-12-26 ENCOUNTER — Encounter: Payer: Self-pay | Admitting: Podiatry

## 2023-12-26 NOTE — Progress Notes (Signed)
 Toenail culture + for fungus (T. Rubrum).  Will contact patient to discuss results and options to treat.

## 2024-02-07 ENCOUNTER — Telehealth: Payer: Self-pay | Admitting: Podiatry

## 2024-02-07 NOTE — Telephone Encounter (Addendum)
 Patient is waiting on results of fungus to know what medicine they can put him on.  Please call.  Call Markham Silence at [phone]

## 2024-03-04 LAB — LAB REPORT - SCANNED
A1c: 6.6
EGFR (African American): 84
Free T4: 1.1 ng/dL
TSH: 3.17 (ref 0.41–5.90)

## 2024-03-06 ENCOUNTER — Other Ambulatory Visit: Payer: Self-pay | Admitting: Podiatry

## 2024-03-06 ENCOUNTER — Telehealth: Payer: Self-pay | Admitting: Podiatry

## 2024-03-06 MED ORDER — TERBINAFINE HCL 250 MG PO TABS: 250.0000 mg | ORAL_TABLET | Freq: Every day | ORAL | 0 refills | Status: AC

## 2024-03-06 NOTE — Telephone Encounter (Signed)
 Spoke to [place], Nicholas Thornton's phone is turned off and not accepting messages. They will obtain a copy of his most recent blood work and get it to us  so we can send in the oral medication.

## 2024-03-06 NOTE — Progress Notes (Signed)
 Received patient's blood work from 03/04/2024.  His AST and ALT enzymes were within normal limits.  A prescription for oral terbinafine 250 mg 1 tablet p.o. daily x 90 days was sent to his pharmacy today.  Will have him follow-up in approximately 3 months towards the completion of the medication for recheck

## 2024-03-06 NOTE — Telephone Encounter (Signed)
 Fiance(Danielle Suezanne Emperor) is stating she has called several times and has not heard from Dr. Estle Hemp regarding results from samples taken of fungus on foot . She would like to speak with provider. Danielle contact telephone number, [phone]/Patient contact telephone number, [phone]

## 2024-03-19 ENCOUNTER — Telehealth: Payer: Self-pay | Admitting: Urology

## 2024-03-19 NOTE — Telephone Encounter (Signed)
 Received this message from Dr. Estle Hemp ------ I sent in his Rx to his pharmacy after reviewing his labs.  He can pick it up now.  We need to see him in 3-4 months for a fungal nail recheck.  Thanks.   I have called the pt multiple times now to let him know about the RX and make his follow up appt. I have left 2 messages and the other 2 times I have been unable to leave a message due to VM being full. I will continue to try and get in touch with pt to inform him about the above message from Dr. McCaughan and schedule his fu appt.

## 2024-04-08 NOTE — Telephone Encounter (Signed)
 I called pt again with no answer and VM is still full. Pt still has not read the my chart message that I sent on 03/24/24.
